# Patient Record
Sex: Male | Born: 1945 | Race: White | Hispanic: No | Marital: Married | Smoking: Never smoker
Health system: Southern US, Community
[De-identification: ages and names within clinical notes are randomized; demographics above are authoritative.]

## PROBLEM LIST (undated history)

## (undated) DIAGNOSIS — R011 Cardiac murmur, unspecified: Secondary | ICD-10-CM

## (undated) DIAGNOSIS — E559 Vitamin D deficiency, unspecified: Secondary | ICD-10-CM

## (undated) DIAGNOSIS — I420 Dilated cardiomyopathy: Secondary | ICD-10-CM

## (undated) DIAGNOSIS — G4730 Sleep apnea, unspecified: Secondary | ICD-10-CM

## (undated) DIAGNOSIS — M48061 Spinal stenosis, lumbar region without neurogenic claudication: Secondary | ICD-10-CM

## (undated) DIAGNOSIS — G5600 Carpal tunnel syndrome, unspecified upper limb: Secondary | ICD-10-CM

## (undated) DIAGNOSIS — I10 Essential (primary) hypertension: Secondary | ICD-10-CM

## (undated) DIAGNOSIS — G4733 Obstructive sleep apnea (adult) (pediatric): Secondary | ICD-10-CM

## (undated) DIAGNOSIS — R0609 Other forms of dyspnea: Secondary | ICD-10-CM

## (undated) DIAGNOSIS — I5189 Other ill-defined heart diseases: Secondary | ICD-10-CM

## (undated) DIAGNOSIS — T753XXA Motion sickness, initial encounter: Secondary | ICD-10-CM

## (undated) DIAGNOSIS — M4802 Spinal stenosis, cervical region: Secondary | ICD-10-CM

## (undated) DIAGNOSIS — G4700 Insomnia, unspecified: Secondary | ICD-10-CM

## (undated) DIAGNOSIS — Z7982 Long term (current) use of aspirin: Secondary | ICD-10-CM

## (undated) DIAGNOSIS — R7303 Prediabetes: Secondary | ICD-10-CM

## (undated) DIAGNOSIS — N400 Enlarged prostate without lower urinary tract symptoms: Secondary | ICD-10-CM

## (undated) DIAGNOSIS — I493 Ventricular premature depolarization: Secondary | ICD-10-CM

## (undated) DIAGNOSIS — I4729 Other ventricular tachycardia: Secondary | ICD-10-CM

## (undated) DIAGNOSIS — Z9989 Dependence on other enabling machines and devices: Secondary | ICD-10-CM

## (undated) HISTORY — DX: Essential (primary) hypertension: I10

## (undated) HISTORY — DX: Dependence on other enabling machines and devices: Z99.89

## (undated) HISTORY — PX: OTHER SURGICAL HISTORY: SHX169

## (undated) HISTORY — DX: Spinal stenosis, cervical region: M48.02

## (undated) HISTORY — DX: Vitamin D deficiency, unspecified: E55.9

## (undated) HISTORY — PX: CARDIAC ELECTROPHYSIOLOGY STUDY AND ABLATION: SHX1294

## (undated) HISTORY — DX: Ventricular premature depolarization: I49.3

## (undated) HISTORY — DX: Spinal stenosis, lumbar region without neurogenic claudication: M48.061

## (undated) HISTORY — PX: TOTAL HIP ARTHROPLASTY: SHX124

---

## 2012-06-07 DIAGNOSIS — D039 Melanoma in situ, unspecified: Secondary | ICD-10-CM | POA: Insufficient documentation

## 2012-06-07 DIAGNOSIS — Z860100 Personal history of colon polyps, unspecified: Secondary | ICD-10-CM | POA: Insufficient documentation

## 2012-06-07 DIAGNOSIS — N529 Male erectile dysfunction, unspecified: Secondary | ICD-10-CM | POA: Insufficient documentation

## 2012-06-07 DIAGNOSIS — J302 Other seasonal allergic rhinitis: Secondary | ICD-10-CM | POA: Insufficient documentation

## 2012-06-07 DIAGNOSIS — G4733 Obstructive sleep apnea (adult) (pediatric): Secondary | ICD-10-CM | POA: Insufficient documentation

## 2012-06-07 DIAGNOSIS — I10 Essential (primary) hypertension: Secondary | ICD-10-CM | POA: Insufficient documentation

## 2012-06-07 HISTORY — DX: Melanoma in situ, unspecified: D03.9

## 2012-06-07 HISTORY — DX: Other seasonal allergic rhinitis: J30.2

## 2012-06-07 HISTORY — DX: Male erectile dysfunction, unspecified: N52.9

## 2012-11-01 DIAGNOSIS — M1990 Unspecified osteoarthritis, unspecified site: Secondary | ICD-10-CM | POA: Insufficient documentation

## 2012-11-01 DIAGNOSIS — M170 Bilateral primary osteoarthritis of knee: Secondary | ICD-10-CM

## 2012-11-01 HISTORY — DX: Bilateral primary osteoarthritis of knee: M17.0

## 2012-11-01 HISTORY — DX: Unspecified osteoarthritis, unspecified site: M19.90

## 2012-12-30 ENCOUNTER — Ambulatory Visit: Payer: Self-pay | Admitting: General Practice

## 2012-12-30 LAB — BASIC METABOLIC PANEL
BUN: 13 mg/dL (ref 7–18)
Calcium, Total: 9 mg/dL (ref 8.5–10.1)
Chloride: 104 mmol/L (ref 98–107)
Co2: 26 mmol/L (ref 21–32)
Creatinine: 0.68 mg/dL (ref 0.60–1.30)
EGFR (African American): >60
EGFR (Non-African Amer.): >60
Glucose: 89 mg/dL (ref 65–99)
Potassium: 4 mmol/L (ref 3.5–5.1)
Sodium: 137 mmol/L (ref 136–145)

## 2012-12-30 LAB — URINALYSIS, COMPLETE
Bacteria: NONE SEEN
Glucose,UR: NEGATIVE mg/dL (ref 0–75)
Leukocyte Esterase: NEGATIVE
Nitrite: NEGATIVE
Ph: 5 (ref 4.5–8.0)
Squamous Epithelial: <1
WBC UR: 1 /HPF (ref 0–5)

## 2012-12-30 LAB — CBC
HCT: 39.1 % — ABNORMAL LOW (ref 40.0–52.0)
HGB: 13.7 g/dL (ref 13.0–18.0)
MCH: 31.8 pg (ref 26.0–34.0)
MCV: 91 fL (ref 80–100)
Platelet: 228 10*3/uL (ref 150–440)
RBC: 4.31 10*6/uL — ABNORMAL LOW (ref 4.40–5.90)
WBC: 9 10*3/uL (ref 3.8–10.6)

## 2012-12-30 LAB — PROTIME-INR: Prothrombin Time: 13.1 secs (ref 11.5–14.7)

## 2012-12-30 LAB — SEDIMENTATION RATE: Erythrocyte Sed Rate: 16 mm/hr (ref 0–20)

## 2012-12-31 LAB — URINE CULTURE

## 2013-01-15 ENCOUNTER — Inpatient Hospital Stay: Payer: Self-pay | Admitting: General Practice

## 2013-01-16 LAB — BASIC METABOLIC PANEL
Co2: 25 mmol/L (ref 21–32)
Creatinine: 0.68 mg/dL (ref 0.60–1.30)
EGFR (Non-African Amer.): >60
Osmolality: 276 (ref 275–301)
Potassium: 4 mmol/L (ref 3.5–5.1)
Sodium: 138 mmol/L (ref 136–145)

## 2013-01-17 LAB — BASIC METABOLIC PANEL
Anion Gap: 7 (ref 7–16)
BUN: 10 mg/dL (ref 7–18)
Calcium, Total: 8.3 mg/dL — ABNORMAL LOW (ref 8.5–10.1)
Chloride: 104 mmol/L (ref 98–107)
Co2: 26 mmol/L (ref 21–32)
Creatinine: 0.65 mg/dL (ref 0.60–1.30)
EGFR (African American): >60
EGFR (Non-African Amer.): >60
Glucose: 103 mg/dL — ABNORMAL HIGH (ref 65–99)
Osmolality: 273 (ref 275–301)
Potassium: 3.8 mmol/L (ref 3.5–5.1)
Sodium: 137 mmol/L (ref 136–145)

## 2013-01-17 LAB — HEMOGLOBIN: HGB: 11.7 g/dL — ABNORMAL LOW (ref 13.0–18.0)

## 2013-05-19 DIAGNOSIS — E538 Deficiency of other specified B group vitamins: Secondary | ICD-10-CM

## 2013-05-19 HISTORY — DX: Deficiency of other specified B group vitamins: E53.8

## 2014-09-25 NOTE — Discharge Summary (Signed)
PATIENT NAME:  Bryan RubensteinBRAY, Dyami W MR#:  [id number] DATE OF BIRTH:  Sep 25, 1945  DATE OF ADMISSION:  01/15/2013 DATE OF DISCHARGE:  01/18/2013  ADMITTING DIAGNOSIS: Degenerative arthrosis of the left hip.   DISCHARGE DIAGNOSIS: Degenerative arthrosis of the left hip.  HISTORY: The patient is a pleasant 69 year old gentleman who has been followed at [facility] for progression of left hip pain. The patient had previously underwent a total hip arthroplasty on the right approximately 15 years ago and had done well. Recently he has been having increased significant left hip and groin pain with weight-bearing activities. He had been having no relief with conservative care. The patient has tried losing weight, but has been having difficulty due to the severity of the left hip pain. The patient states that the pain had increased to the point that it was significantly interfering with his activities of daily living. X-rays taken in [facility] orthopedics showed significant narrowing of the cartilage space superiorly. There was subchondral sclerosis as well as osteophyte formation. After discussion of the risks and benefits of surgical intervention, the patient expressed his understanding of the risks and benefits and agreed with plans for surgical intervention.   PROCEDURE: Left total hip arthroplasty.   ANESTHESIA: Spinal.   IMPLANTS UTILIZED: DePuy 16.5 mm small stature AML femoral component stem, a 54 mm outer diameter Pinnacle 100 acetabular component, +4 mm neutral Pinnacle Marathon polyethylene liner, and a 36 mm M-Spec femoral head with a +1.5 mm neck length.   HOSPITAL COURSE: The patient tolerated the procedure very well. He had no complications. He was then taken to PAC-U where he was stabilized and then transferred to the orthopedic floor. The patient began receiving anticoagulation therapy of Lovenox 30 mg subcu q. 12 hours per anesthesia and pharmacy protocol. He was fitted with TED stockings  bilaterally. These were allowed to be removed 1 hour per 8 hour shift. The patient was also fitted with the AVI compression foot pumps bilaterally set at 80 mmHg. His calves have been nontender. There has been no evidence of any DVTs. Negative Homan signs. Heels were elevated off the bed using rolled towels.   The patient has denied any chest pain or any shortness of breath. Vital signs are stable. He has been afebrile. Hemodynamically he was stable and no transfusions were given postoperatively.   The patient's IV, Foley and Hemovac were DC'd on day 2 along with a dressing change. The wound was free of any drainage or signs of infection.   Physical therapy was initiated on day 1 for ADL and assistive devices. He has done very well. Upon being discharged was ambulating greater than 200 feet. He was independent with bed to chair transfers. He was able to go up and down 4 sets of steps. Occupational therapy was also initiated on day 1 for ADL and assistive devices.   DISPOSITION: The patient is being discharged to home in improved stable condition.   DISCHARGE INSTRUCTIONS: He is to continue weight bearing as tolerated. Continue using a walker until cleared by physical therapy to go to a quad cane. He will receive home health PT. Continue with TED stockings bilaterally. These are to be worn during the day but may be removed at night. Elevate the heels when sitting or in bed. Incentive spirometer q. 1 hours while awake. He is placed on a regular diet. The dressing is to stay clean and dry. It is not to be changed unless needed. He is not to take a shower.  Staples will be removed on 08/27 followed by the application of benzoin and Steri-Strips. He will continue receiving home health PT. He has a follow-up appointment in [facility] on 09/25 at 10:45. He is to call the clinic sooner if any temperatures of 101.5 or greater or excessive bleeding.   DRUG ALLERGIES: No known drug allergies.   DISCHARGE  MEDICATIONS: The patient is to resume his regular medication that he was on prior to admission. He was given a prescription for Roxicodone 5 to 10 mg q. 4 to 6 hours p.r.n. for pain, Tramadol 50 to 100 mg q. 4 to 6 hours p.r.n. for pain, Lovenox 40 mg sub-Q daily for 14 days and then discontinue and begin taking one 81 mg enteric-coated aspirin.   PAST MEDICAL HISTORY: 1.  Heart murmur.  2.  Hypertension.  3.  Melanoma. 4.  Obesity. 5.  Diverticulosis bleeding in 2008. 6.  Cancer. 7.  Cataracts. 8.  Psoriasis. 9.  Hypercholesterolemia.   ____________________________ Van Clines, PA jrw:sb D: 02/04/2013 07:59:18 ET T: 02/04/2013 08:18:38 ET JOB#: [id number]  cc: Van Clines, PA, <Dictator> Keonna Raether PA ELECTRONICALLY SIGNED 02/05/2013 7:26

## 2014-09-25 NOTE — Op Note (Signed)
PATIENT NAME:  Bryan Boyle, Bryan Boyle MR#:  [id number] DATE OF BIRTH:  11-Aug-1945  DATE OF PROCEDURE:  01/15/2013  PREOPERATIVE DIAGNOSIS:  Degenerative arthrosis of the left hip.   POSTOPERATIVE DIAGNOSIS:  Degenerative arthrosis of the left hip.   PROCEDURE PERFORMED:  Left total hip arthroplasty.   SURGEON:  Illene LabradorJames P. Hooten, M.D.   ASSISTANT:  Van ClinesJon Wolfe, PA (required to maintain retraction throughout the procedure)   ANESTHESIA:  Spinal.   ESTIMATED BLOOD LOSS:  200 mL.   FLUIDS REPLACED:  4300 mL of crystalloid.   DRAINS:  Two medium drains to Hemovac reservoir.   IMPLANTS UTILIZED:  DePuy 16.5 mm small stature AML femoral stem, a 54 mm outer diameter Pinnacle 100 acetabular component, +4 mm neutral Pinnacle Marathon polyethylene liner, and a 36 mm M-Spec femoral head with a +1.5 mm neck length.   INDICATIONS FOR SURGERY:  The patient is a 69 year old male who has been seen for complaints of progressive left hip and groin pain.  X-rays demonstrate severe degenerative changes to the left hip.  After discussion of the risks and benefits of surgical intervention, the patient expressed understanding of the risks and benefits and agreed with plans for surgical intervention.   PROCEDURE IN DETAIL:  The patient was brought into the Operating Room and, after adequate spinal anesthesia was achieved, the patient was placed in right lateral decubitus position.  An axillary roll was placed and all bony prominences were well-padded.  The patient's left hip and leg were cleaned and prepped with alcohol and DuraPrep, draped in the usual sterile fashion.  A "timeout" was performed as per usual protocol.  A lateral curvilinear incision was made gently curving towards the posterior superior iliac spine.  IT band was incised in line with the skin incision and the fibers of the gluteus maximus were split in line.  Piriformis tendon was identified, skeletonized, and incised at its insertion at the proximal femur and  reflected posteriorly.  In a similar fashion, short external rotators were incised and reflected posteriorly.  A T-type posterior capsulotomy was performed.  Prior to dislocation of the femoral head, a threaded Steinmann pin was inserted through a separate stab incision into the pelvis superior to the acetabulum and stylus was formed so as to assess limb length and hip offset throughout the procedure.  The femoral head was then dislocated posteriorly.  Inspection of the head showed severe degenerative changes with full thickness loss of articular cartilage superiorly.  Prominent osteophytes were also noted.  The femoral neck cut was performed using an oscillating saw.  The anterior capsule was elevated off of the femoral neck.  Inspection of the acetabulum also demonstrated significant degenerative changes.  Remnant of the labrum was excised.  The acetabulum was reamed in a sequential fashion up to a 53 mm diameter.  This allowed for excellent punctate bleeding bone.  A 54 mm outer diameter Pinnacle 100 acetabular component was positioned and impacted into place.  Excellent scratch fit was appreciated.  A +4 mm neutral polyethylene trial was inserted and attention was directed to the proximal femur.  Pilot hole for reaming of the proximal femoral canal was created using a high-speed bur.  Proximal femoral canal was reamed in a sequential fashion up to a 16 mm diameter.  This allowed for approximately 6.5 mm of scratch fit.  The proximal femur was then prepared using a 16.5 mm aggressive side-biting reamer.  Serial broaches were inserted up to a 16.5 mm small stature broach.  The calcar region was planed and trial reduction was performed with a 36 mm hip ball with a +1.5 mm neck length.  Good equalization of limb lengths was appreciated and a good hip offset was noted.  Excellent stability was noted both anteriorly and posteriorly.  The hip was dislocated and the trial was removed.  The acetabular shell was irrigated  with copious amounts of normal saline with antibiotic solution and then suctioned dry.  A +4 mm neutral Pinnacle Marathon polyethylene liner was positioned and impacted into place.  Next, a 16.5 mm small stature AML femoral component was positioned and impacted into place.  Excellent scratch fit was appreciated.  Trial reduction was again performed with a 36 mm hip ball with a +1.5 mm neck length.  Again, excellent stability was noted both anteriorly and posteriorly and good equalization of limb lengths was appreciated.  Trial hip ball was removed.  The Morse taper was cleaned and dried and a 36 mm M-Spec femoral head with a +1.5 mm neck length was placed on the trunnion and impacted into place.  The hip was reduced and placed through a range of motion.  Again, excellent stability was noted anteriorly and posteriorly and appropriate limb lengths were appreciated.  The wound was irrigated with copious amounts of normal saline with antibiotic solution using pulsatile lavage and then suctioned dry.  Good hemostasis was appreciated.  The posterior capsulotomy was repaired using #5 Ethibond.  The piriformis tendon was reapproximated on the undersurface of the gluteus medius tendon.  The gluteal sling that had been previously released for mobilization of the femur was also repaired using #5 Ethibond.  Two medium drains were placed through the wound bed and brought out through a separate stab incision to be attached to a Hemovac reservoir.  IT band was repaired using interrupted sutures of #1 Vicryl.  The subcutaneous tissue was approximated in layers using first #0 Vicryl followed by #2-0 Vicryl.  Skin was closed with skin staples.  Sterile dressing was applied.   The patient tolerated the procedure well.  He was transported to the recovery room in stable condition.    ____________________________ Illene Labrador. Angie Fava., MD jph:ea D: 01/16/2013 06:33:28 ET T: 01/16/2013 06:53:32 ET JOB#: [id number]  cc: Illene Labrador.  Angie Fava., MD, <Dictator> JAMES P Angie Fava MD ELECTRONICALLY SIGNED 02/12/2013 7:07

## 2014-12-27 DIAGNOSIS — Z96643 Presence of artificial hip joint, bilateral: Secondary | ICD-10-CM | POA: Insufficient documentation

## 2016-02-28 ENCOUNTER — Other Ambulatory Visit: Payer: Self-pay | Admitting: Orthopedic Surgery

## 2016-02-28 DIAGNOSIS — M545 Low back pain, unspecified: Secondary | ICD-10-CM

## 2016-02-28 DIAGNOSIS — G8929 Other chronic pain: Secondary | ICD-10-CM

## 2016-04-14 ENCOUNTER — Ambulatory Visit
Admission: RE | Admit: 2016-04-14 | Discharge: 2016-04-14 | Disposition: A | Discharge status: Home / Self Care | Payer: Medicare Other | Source: Ambulatory Visit | Attending: Orthopedic Surgery | Admitting: Orthopedic Surgery

## 2016-04-14 DIAGNOSIS — G8929 Other chronic pain: Secondary | ICD-10-CM | POA: Diagnosis not present

## 2016-04-14 DIAGNOSIS — M5126 Other intervertebral disc displacement, lumbar region: Secondary | ICD-10-CM | POA: Insufficient documentation

## 2016-04-14 DIAGNOSIS — M545 Low back pain, unspecified: Secondary | ICD-10-CM

## 2016-04-14 DIAGNOSIS — M48061 Spinal stenosis, lumbar region without neurogenic claudication: Secondary | ICD-10-CM | POA: Insufficient documentation

## 2016-04-14 DIAGNOSIS — M4316 Spondylolisthesis, lumbar region: Secondary | ICD-10-CM | POA: Diagnosis not present

## 2017-04-24 ENCOUNTER — Other Ambulatory Visit: Payer: Self-pay | Admitting: Neurosurgery

## 2017-04-24 DIAGNOSIS — M4312 Spondylolisthesis, cervical region: Secondary | ICD-10-CM

## 2017-05-09 ENCOUNTER — Other Ambulatory Visit: Payer: Medicare Other

## 2017-05-14 ENCOUNTER — Other Ambulatory Visit: Payer: Medicare Other

## 2017-05-20 ENCOUNTER — Ambulatory Visit
Admission: RE | Admit: 2017-05-20 | Discharge: 2017-05-20 | Disposition: A | Discharge status: Home / Self Care | Payer: Medicare Other | Source: Ambulatory Visit | Attending: Neurosurgery | Admitting: Neurosurgery

## 2017-05-20 DIAGNOSIS — M4312 Spondylolisthesis, cervical region: Secondary | ICD-10-CM

## 2018-07-19 DIAGNOSIS — M1712 Unilateral primary osteoarthritis, left knee: Secondary | ICD-10-CM | POA: Insufficient documentation

## 2018-07-19 DIAGNOSIS — M1711 Unilateral primary osteoarthritis, right knee: Secondary | ICD-10-CM | POA: Insufficient documentation

## 2019-04-02 IMAGING — MR MR CERVICAL SPINE W/O CM
4 of 5 series · 29 of 48 positions shown · non-contrast
Comparison: Cervical spine radiographs 04/16/2017

CLINICAL DATA: Left-sided neck pain for 1 year. Right-sided neck
pain for 6 months. Weakness and numbness greater on the right.

EXAM:
MRI CERVICAL SPINE WITHOUT CONTRAST
TECHNIQUE: Multiplanar, multisequence MR imaging of the cervical spine was
performed. No intravenous contrast was administered.

[Series 6: T1 · sagittal · 3.0mm · 0.66mm/px · 8 of 17 slices shown]
[im 1/17]
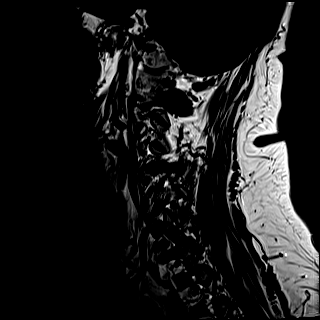
[im 3/17]
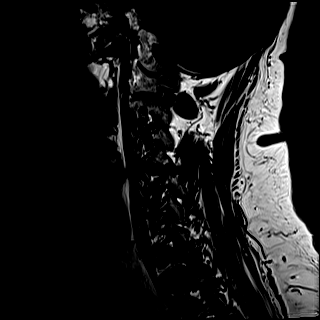
[im 5/17]
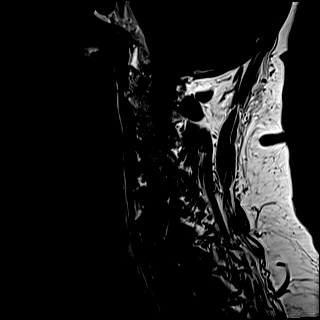
[im 7/17]
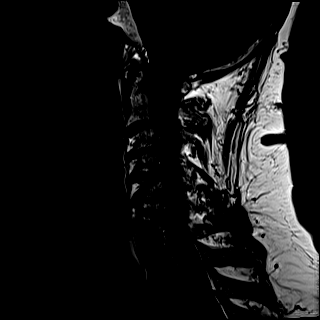
[im 10/17]
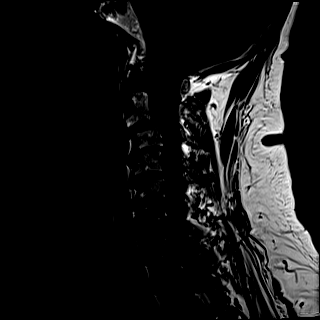
[im 12/17]
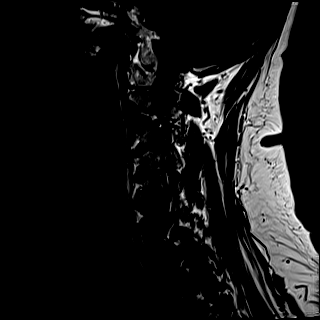
[im 14/17]
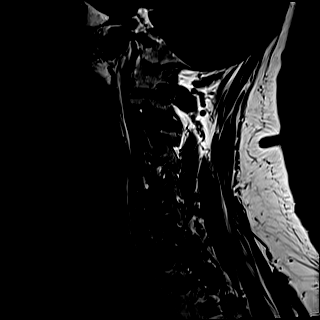
[im 17/17]
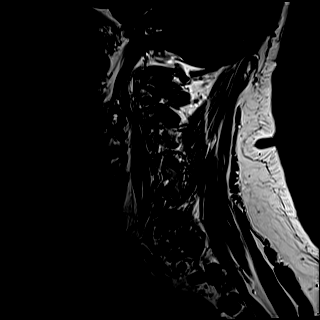

[Series 7: T2 · sagittal · 3.0mm · 0.55mm/px · 7 of 17 slices shown (1 of 2)]
[im 1/17]
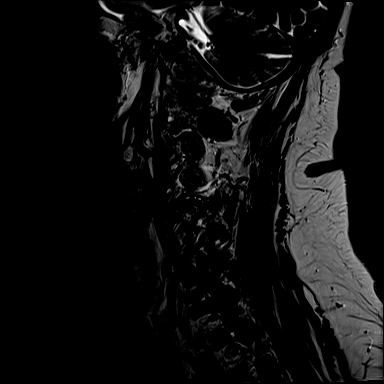
[im 3/17]
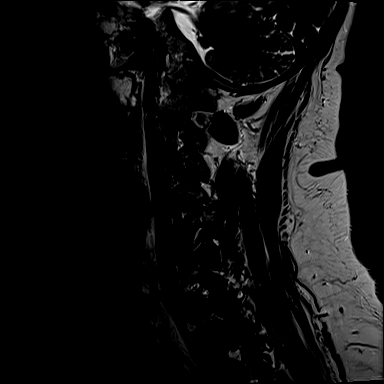
[im 6/17]
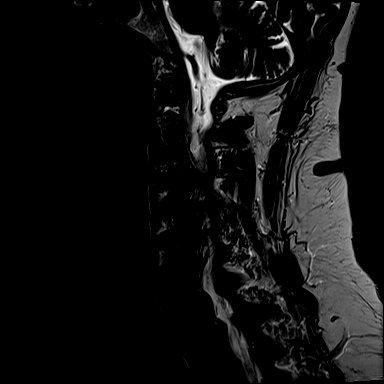
[im 9/17]
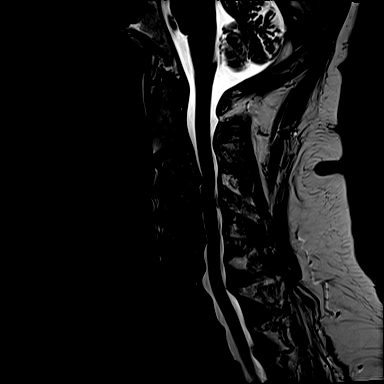
[im 11/17]
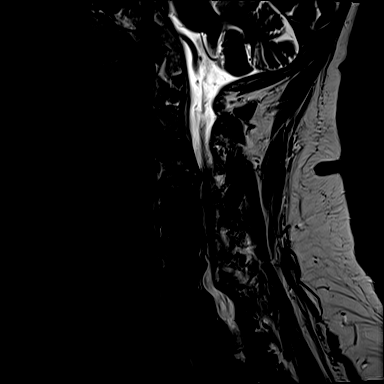
[im 14/17]
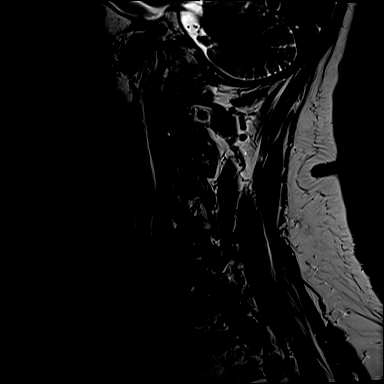
[im 17/17]
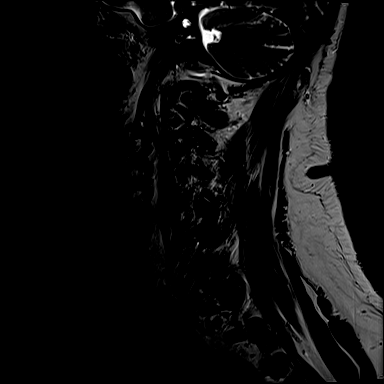

[Series 8: STIR · sagittal · 3.0mm · 0.33mm/px · 5 of 17 slices shown]
[im 1/17]
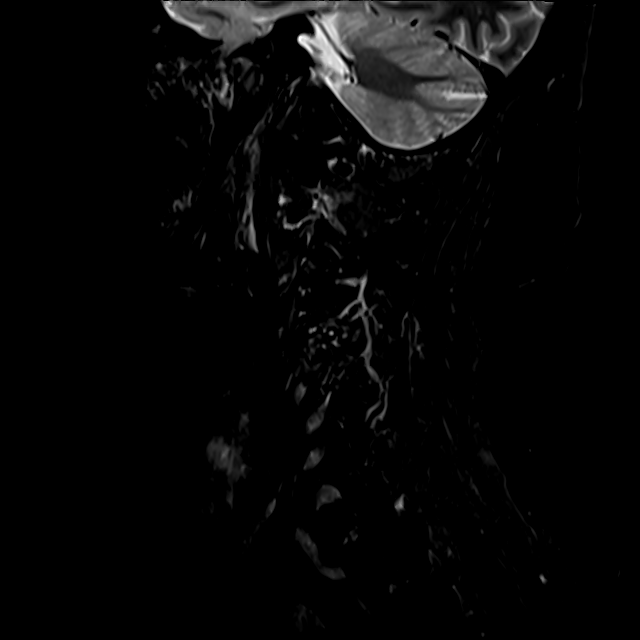
[im 3/17]
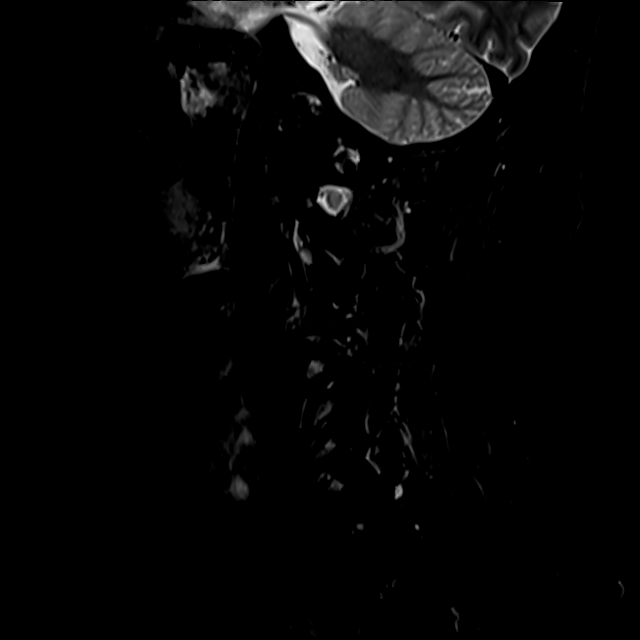
[im 6/17]
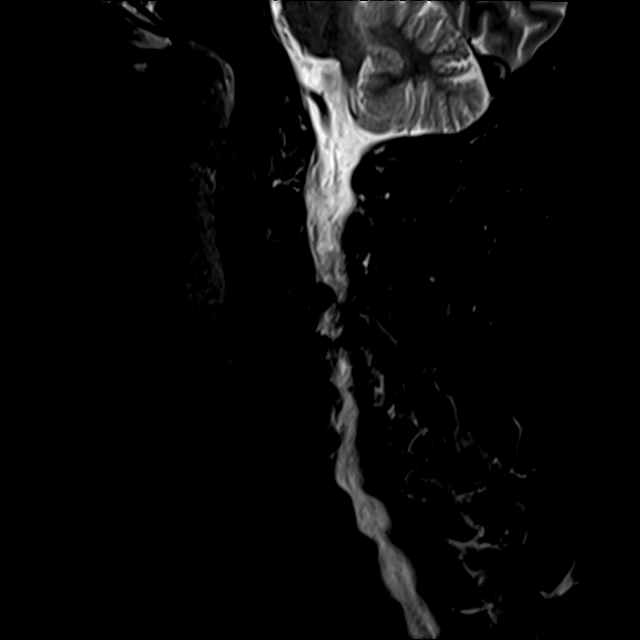
[im 9/17]
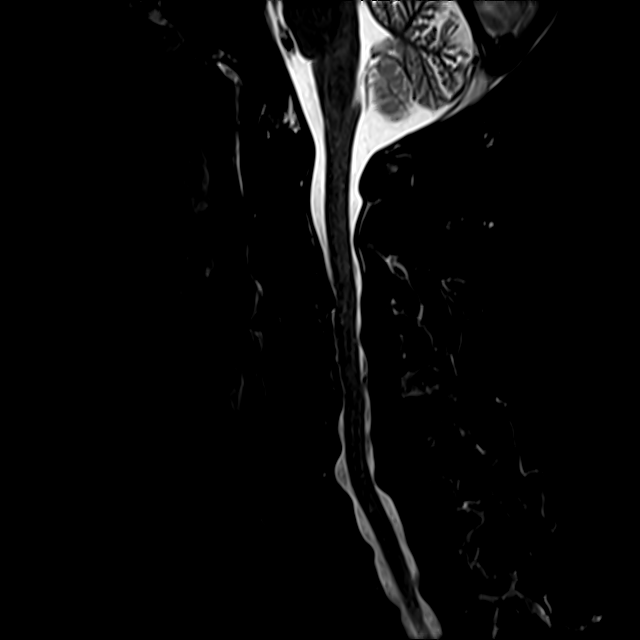
[im 14/17]
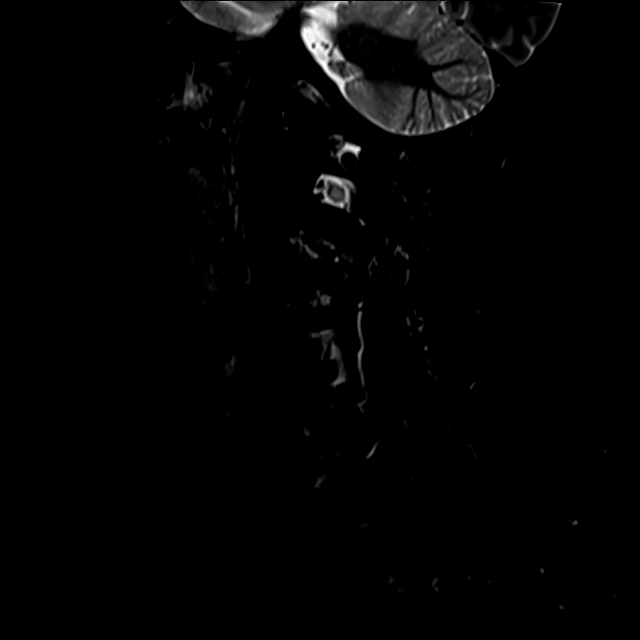

[Series 9: T2 · axial · 3.0mm · 0.50mm/px · z∈[-100,-1]mm · 9 of 32 slices shown (2 of 2)]
[im 1/32]
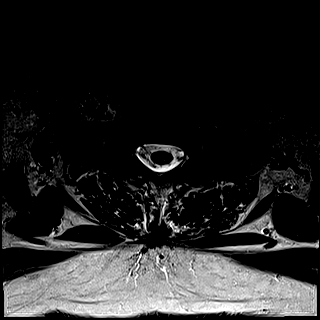
[im 6/32]
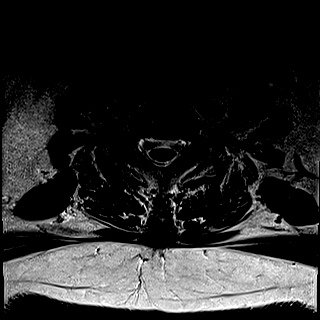
[im 11/32]
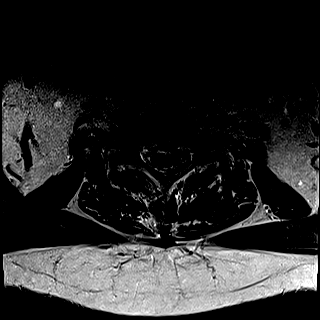
[im 13/32]
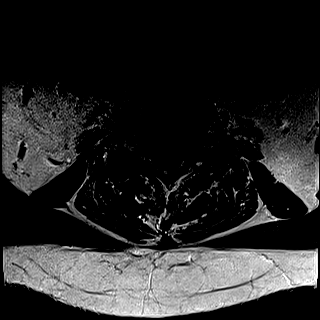
[im 16/32]
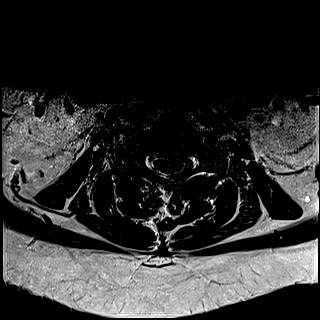
[im 19/32]
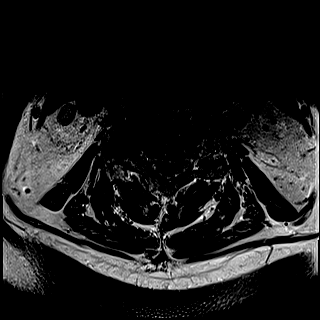
[im 21/32]
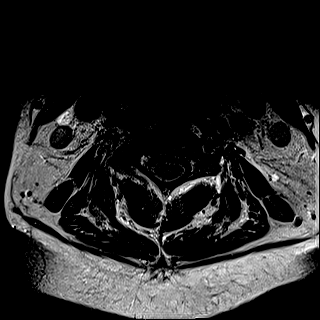
[im 26/32]
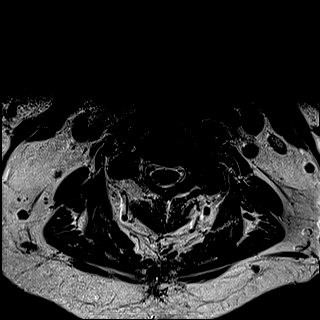
[im 32/32]
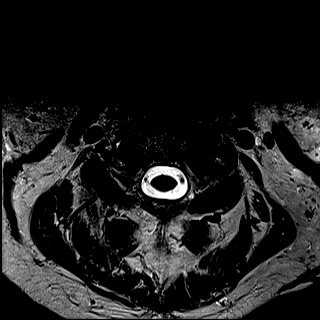

[29 of 48 positions shown; findings below may reference images not displayed]

FINDINGS: Alignment: Mild reversal of the normal cervical lordosis. Minimal
anterolisthesis of C2 on C3 and C4 on C5. 4 mm anterolisthesis of C7
on T1. Minimal retrolisthesis of C3 on C4 and C5 on C6.

Vertebrae: No fracture or suspicious osseous lesion. Facet edema on
the left at C2-3 and C7-T1 and on the right at C4-5 and C7-T1. Mild
degenerative endplate edema at C3-4.

Cord: Normal signal.

Posterior Fossa, vertebral arteries, paraspinal tissues:
Unremarkable.

Disc levels:

Moderate to severe disc space narrowing throughout the cervical
spine with exception of C2-3. Diffuse disc desiccation.

C2-3: Anterolisthesis with mild disc uncovering and mild right and
severe left facet arthrosis without stenosis.

C3-4: Broad-based posterior disc osteophyte complex greater to the
right results in mild spinal stenosis with a slight impression on
the ventral spinal cord and severe right and moderate left neural
foraminal stenosis.

C4-5: Disc bulging asymmetric to the right, right uncovertebral
spurring, and severe right facet arthrosis result in moderate right
neural foraminal stenosis and mild spinal stenosis.

C5-6: Broad-based posterior disc osteophyte complex results in mild
spinal stenosis and mild right and severe left neural foraminal
stenosis.

C6-7: Broad-based posterior disc osteophyte complex results in mild
spinal stenosis and moderate to severe right and severe left neural
foraminal stenosis.

C7-T1: Anterolisthesis with disc uncovering, uncovertebral spurring,
and moderate facet arthrosis result in mild left neural foraminal
stenosis without spinal stenosis.

T1-2: Only imaged sagittally. Disc bulging asymmetric to the right,
endplate spurring, and mild right facet arthrosis result in moderate
right neural foraminal stenosis without spinal stenosis.

T2-3: Only imaged sagittally. Mild disc bulging and mild facet
arthrosis without stenosis.
IMPRESSION: 1. Advanced diffuse cervical disc and facet degeneration. Mild
spinal stenosis from C3-4 to C6-7.
2. Moderate to severe multilevel neural foraminal stenosis as above.

## 2019-09-23 DIAGNOSIS — I493 Ventricular premature depolarization: Secondary | ICD-10-CM | POA: Insufficient documentation

## 2019-10-09 DIAGNOSIS — I429 Cardiomyopathy, unspecified: Secondary | ICD-10-CM | POA: Insufficient documentation

## 2019-10-09 HISTORY — DX: Cardiomyopathy, unspecified: I42.9

## 2020-01-26 HISTORY — PX: CARDIAC ELECTROPHYSIOLOGY STUDY AND ABLATION: SHX1294

## 2020-01-26 HISTORY — PX: LEFT HEART CATH: SHX5946

## 2020-11-15 DIAGNOSIS — H353133 Nonexudative age-related macular degeneration, bilateral, advanced atrophic without subfoveal involvement: Secondary | ICD-10-CM

## 2020-11-15 DIAGNOSIS — D3132 Benign neoplasm of left choroid: Secondary | ICD-10-CM

## 2020-11-15 HISTORY — DX: Benign neoplasm of left choroid: D31.32

## 2020-11-15 HISTORY — DX: Nonexudative age-related macular degeneration, bilateral, advanced atrophic without subfoveal involvement: H35.3133

## 2021-05-23 DIAGNOSIS — Z8679 Personal history of other diseases of the circulatory system: Secondary | ICD-10-CM | POA: Insufficient documentation

## 2021-07-14 DIAGNOSIS — R7303 Prediabetes: Secondary | ICD-10-CM | POA: Insufficient documentation

## 2021-07-14 DIAGNOSIS — E559 Vitamin D deficiency, unspecified: Secondary | ICD-10-CM | POA: Insufficient documentation

## 2022-07-27 DIAGNOSIS — H3530 Unspecified macular degeneration: Secondary | ICD-10-CM

## 2022-07-27 HISTORY — DX: Unspecified macular degeneration: H35.30

## 2023-01-19 DIAGNOSIS — G894 Chronic pain syndrome: Secondary | ICD-10-CM | POA: Insufficient documentation

## 2023-01-19 HISTORY — DX: Chronic pain syndrome: G89.4

## 2023-05-25 DIAGNOSIS — H53413 Scotoma involving central area, bilateral: Secondary | ICD-10-CM

## 2023-05-25 HISTORY — DX: Scotoma involving central area, bilateral: H53.413

## 2023-09-27 NOTE — Progress Notes (Unsigned)
 Referring Physician:  No referring provider defined for this encounter.  Primary Physician:  System, Provider Not In  History of Present Illness: 10/02/2023 Bryan Boyle has a history of CAD, RFA for cardiac arrhythmia, HTN, high cholesterol, OSA, macular degeneration.   Primary complaint is numbness/tingling in bilateral hands, worse in index and middle finger x 10+ years. Symptoms worse at rest. He has intermittent neck pain that tends to be worse at night- it is tolerable. No radicular arm pain. Wearing rubber bands on his fingers helps with his numbness/tingling. He has weakness in both hands. No numbness/tingling in his arms. He notes some dexterity issues. He has some chronic balance issues.   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   He has intermittent LBP with occasional left posterior leg pain to his knee. He has occasional weakness in left leg. He has numbness and tingling in his feet- neurontin helps this.   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   He does not smoke.   Bowel/Bladder Dysfunction: none  Conservative measures:  Physical therapy:  has done OT for hands and PT for his neck about 2 years ago with improvement.   Multimodal medical therapy including regular antiinflammatories: Gabapentin, Meloxicam  Injections: no epidural steroid injections  Past Surgery: no spinal surgeries  Bryan Boyle has some dexterity issues. He has chronic balance issues.   The symptoms are causing a significant impact on the patient's life.   Review of Systems:  A 10 point review of systems is negative, except for the pertinent positives and negatives detailed in the HPI.  Past Medical History: History reviewed. No pertinent past medical history.  Past Surgical History: History reviewed. No pertinent surgical  history.  Allergies: Allergies as of 10/02/2023 - Review Complete 10/02/2023  Allergen Reaction Noted   Rofecoxib Other (See Comments) 04/11/2012    Medications: Outpatient Encounter Medications as of 10/02/2023  Medication Sig   cetirizine (ZYRTEC) 10 MG tablet Take 1 tablet by mouth daily.   lovastatin (MEVACOR) 40 MG tablet Take 40 mg by mouth.   nitroGLYCERIN (NITROSTAT) 0.4 MG SL tablet 0 Refill(s)   spironolactone (ALDACTONE) 25 MG tablet Take 1 tablet by mouth daily.   ascorbic acid (VITAMIN C) 500 MG tablet Take 500 mg by mouth.   aspirin EC 81 MG tablet Take 81 mg by mouth.   Cholecalciferol 125 MCG (5000 UT) TABS Take 5,000 Units by mouth.   Coenzyme Q10 10 MG capsule Take 10 mg by mouth.   CRANBERRY PO Take 25,000 mg by mouth.   cyanocobalamin (VITAMIN B12) 1000 MCG tablet Take 1,000 mcg by mouth.   gabapentin (NEURONTIN) 400 MG capsule Take 400 mg by mouth.   magnesium oxide (MAG-OX) 400 MG tablet Take 250 mg by mouth.   Melatonin 12 MG TABS Take 1 tablet by mouth daily.   potassium chloride (KLOR-CON M) 10 MEQ tablet Take 10 mEq by mouth.   No facility-administered encounter medications on file as of 10/02/2023.    Social History: Social History   Tobacco Use   Smoking status: Never   Smokeless tobacco: Never    Family Medical History: History reviewed. No pertinent family history.  Physical Examination: Vitals:   10/02/23 1330  BP: 122/78    General: Patient is well developed, well nourished, calm, collected, and in no apparent distress. Attention to examination is appropriate.  Respiratory: Patient is  breathing without any difficulty.   NEUROLOGICAL:     Awake, alert, oriented to person, place, and time.  Speech is clear and fluent. Fund of knowledge is appropriate.   Cranial Nerves: Pupils equal round and reactive to light.  Facial tone is symmetric.    No posterior cervical tenderness. No tenderness in bilateral trapezial region.   No abnormal  lesions on exposed skin.   Strength: Side Biceps Triceps Deltoid Interossei Grip Wrist Ext. Wrist Flex.  R 5 5 5 5 5 5 5   L 5 5 5 5 5 5 5    Side Iliopsoas Quads Hamstring PF DF EHL  R 5 5 5 5 5 5   L 5 5 5 5 5 5    Reflexes are 2+ and symmetric at the biceps, brachioradialis, patella and achilles.   Hoffman's is absent.  Clonus is not present.   Bilateral upper and lower extremity sensation is intact to light touch.     Reasonable ROM of both shoulders with no pain.   No pain with IR/ER of both hips.   Negative tinels at wrist and elbow bilaterally. Negative phalens at wrist bilaterally.   Gait is slow.     Medical Decision Making  Imaging: No recent cervical or lumbar imaging.   Assessment and Plan: Bryan Boyle primary complaint is numbness/tingling in bilateral hands, worse in index and middle finger x 10+ years. He has intermittent neck pain that tends to be worse at night- it is tolerable. No radicular arm pain.   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   He also has intermittent LBP with occasional left posterior leg pain to his knee. He has occasional weakness in left leg. He has numbness and tingling in his feet- neurontin helps this.   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   Treatment options discussed with patient and following plan made:   - MRI of cervical spine to further evaluate neck pain along with previous seen C8 radiculopathy on EMG.  - Bilateral upper extremity EMG/NCS to evaluate numbness and tingling in hands. Dr. Mason Sole did his last EMG. Referral sent to him.  - Lumbar xrays with flex/ext to be done when he has cervical MRI.  - May revisit PT/OT depending on above results, this has helped him in  the past.  - He would like to follow up when MRI, xrays, and EMG are all complete. Will schedule visit with me once I have all of the results  I spent a total of 45 minutes in face-to-face and non-face-to-face activities related to this patient's care today including review of outside records, review of imaging, review of symptoms, physical exam, discussion of differential diagnosis, discussion of treatment options, and documentation.   Thank you for involving me in the care of this patient.   Lucetta Russel PA-C Dept. of Neurosurgery

## 2023-10-02 ENCOUNTER — Encounter: Payer: Self-pay | Admitting: Orthopedic Surgery

## 2023-10-02 ENCOUNTER — Telehealth: Payer: Self-pay | Admitting: Orthopedic Surgery

## 2023-10-02 ENCOUNTER — Ambulatory Visit (INDEPENDENT_AMBULATORY_CARE_PROVIDER_SITE_OTHER): Admitting: Orthopedic Surgery

## 2023-10-02 VITALS — BP 122/78 | Ht 71.0 in | Wt 316.0 lb

## 2023-10-02 DIAGNOSIS — M4802 Spinal stenosis, cervical region: Secondary | ICD-10-CM

## 2023-10-02 DIAGNOSIS — M5412 Radiculopathy, cervical region: Secondary | ICD-10-CM

## 2023-10-02 DIAGNOSIS — M47816 Spondylosis without myelopathy or radiculopathy, lumbar region: Secondary | ICD-10-CM

## 2023-10-02 DIAGNOSIS — R200 Anesthesia of skin: Secondary | ICD-10-CM

## 2023-10-02 DIAGNOSIS — M5030 Other cervical disc degeneration, unspecified cervical region: Secondary | ICD-10-CM

## 2023-10-02 DIAGNOSIS — M4316 Spondylolisthesis, lumbar region: Secondary | ICD-10-CM | POA: Diagnosis not present

## 2023-10-02 DIAGNOSIS — M542 Cervicalgia: Secondary | ICD-10-CM

## 2023-10-02 NOTE — Telephone Encounter (Signed)
 I ordered EMG for bilateral upper extremities with Dr. Mason Sole. He did previous EMG in 2018.   Thanks.

## 2023-10-02 NOTE — Patient Instructions (Signed)
 It was so nice to see you today. Thank you so much for coming in.    Previous imaging showed wear and tear in your neck and lower back.   Nerve test from 2018 showed neuropathy (nerves being turned on) and C8 radiculopathy (likely from your neck).   I want to get an MRI of your neck to look into things further. We will get this approved through your insurance and [facility] will call you to schedule the appointment. Ask about your patient responsibility. You do not need to pay this prior to getting MRI, they can bill you. Make sure you are scheduled for WIDE BORE MRI.   When you get the MRI, I also want them to do xrays of your lower back.   After you have the MRI and xrays, it takes 14-21 days for me to get the results back. Once I have them and the nerve test results, we will call you to schedule a follow up visit with me to review them.    I want you to see neurology at the [facility] clinic (Dr. Mason Sole) for a nerve test (EMG) in your arms. They should call you to schedule an appointment or you can call them at [phone].   Please do not hesitate to call if you have any questions or concerns. You can also message me in MyChart.   Lucetta Russel PA-C [phone]     The physicians and staff at [facility] at [facility] are committed to providing excellent care. You may receive a survey asking for feedback about your experience at our office. We value you your feedback and appreciate you taking the time to to fill it out. [facility] leadership team is also available to discuss your experience in person, feel free to contact us  [phone].

## 2023-10-02 NOTE — Telephone Encounter (Signed)
 Faxed to [facility]

## 2023-10-03 ENCOUNTER — Encounter: Payer: Self-pay | Admitting: Orthopedic Surgery

## 2023-10-03 NOTE — Addendum Note (Signed)
 Addended by: Babara Lerner on: 10/03/2023 12:05 PM   Modules accepted: Orders

## 2023-11-12 ENCOUNTER — Ambulatory Visit
Admission: RE | Admit: 2023-11-12 | Discharge: 2023-11-12 | Disposition: A | Discharge status: Home / Self Care | Source: Ambulatory Visit | Attending: Orthopedic Surgery | Admitting: Orthopedic Surgery

## 2023-11-12 DIAGNOSIS — M47816 Spondylosis without myelopathy or radiculopathy, lumbar region: Secondary | ICD-10-CM | POA: Diagnosis present

## 2023-11-12 DIAGNOSIS — M4316 Spondylolisthesis, lumbar region: Secondary | ICD-10-CM | POA: Insufficient documentation

## 2023-11-12 DIAGNOSIS — M4802 Spinal stenosis, cervical region: Secondary | ICD-10-CM | POA: Insufficient documentation

## 2023-11-12 DIAGNOSIS — M542 Cervicalgia: Secondary | ICD-10-CM | POA: Insufficient documentation

## 2023-11-12 DIAGNOSIS — M5412 Radiculopathy, cervical region: Secondary | ICD-10-CM | POA: Diagnosis not present

## 2023-11-13 NOTE — Procedures (Signed)
 Test Date:  11/12/2023  Patient: Bryan Boyle DOB: February 17, 1946 Physician: Dr. Jannett Fairly  Chart#: [id number] Sex: Male Ref Phys: Glade Boys   Patient History: Patient is a 78 year-old male who presents with bilateral hand numbness and tingling.  Denies neck pain but does have stiffness.  He is an Advertising account planner on the computer.  Past medical history is significant for cancer.   Exam:  Manual muscle testing revealed bilateral thenar and intrinsic hand muscle atrophy.  Sensation testing revealed decreased sensation to left hand in both median distributions.  EMG & NCV Findings: Evaluation of the Left median motor, the Right median motor, the Left median sensory, the Right median sensory, and the Left ulnar sensory nerves showed no response (Wrist).  The Left ulnar motor nerve showed prolonged distal onset latency (4.5 ms), reduced amplitude (1.6 mV), and decreased conduction velocity (B Elbow-Wrist, 25 m/s).  The Right ulnar motor nerve showed prolonged distal onset latency (4.1 ms), reduced amplitude (2.1 mV), and decreased conduction velocity (A Elbow-B Elbow, 19 m/s).  The Right radial sensory nerve showed prolonged distal peak latency (2.6 ms).  The Right ulnar sensory nerve showed prolonged distal peak latency (4.4 ms), reduced amplitude (1.4 V), and decreased conduction velocity (Wrist-5th Digit, 27 m/s).  The Left median/ulnar (palm) comparison nerve showed no response (Median Palm) and no response (Ulnar Palm).  The Right median/ulnar (palm) comparison nerve showed no response (Median Palm), no response (Ulnar Palm), and no response (Site 3).  All remaining nerves (as indicated in the following tables) were within normal limits.    EMG   Side Muscle Nerve Root Ins Act Fibs Psw Amp Dur Poly Recrt Int Bruna Comment  Left Abd Poll Brev Median C8-T1 Nml Nml Nml Incr >61ms 2+ moderate Reduced Nml   Left 1stDorInt Ulnar C8-T1 Nml Nml Nml Incr >16ms 2+ moderate Reduced Nml   Left ExtDigCom Radial (Post  Int) C7-8 Nml Nml Nml Incr >75ms 1+ mild Reduced Nml   Left ABD Dig Min Ulnar C8-T1 Nml Nml Nml Incr >59ms 1+ mild Reduced Nml   Left PronatorTeres Median C6-7 Nml Nml Nml Nml Nml 0 Nml Nml   Left Biceps Musculocut C5-6 Nml Nml Nml Nml Nml 0 Nml Nml   Left Triceps Radial C6-7-8 Nml Nml Nml Nml Nml 0 Nml Nml   Left Deltoid Axillary C5-6 Nml Nml Nml Nml Nml 0 Nml Nml   Left Cervical Parasp Up Rami C1-3 Nml Nml Nml        Left Cervical Parasp Mid Rami C4-6 Nml Nml Nml        Left Cervical Parasp Low Rami C7-8 Nml Nml Nml        Right Abd Poll Brev Median C8-T1 Nml Nml Nml Incr >75ms 2+ moderate Reduced Nml   Right 1stDorInt Ulnar C8-T1 Nml Nml Nml Incr >47ms 2+ moderate Reduced Nml   Right ExtDigCom Radial (Post Int) C7-8 Nml Nml Nml Incr >34ms 1+ mild Reduced Nml   Right ABD Dig Min Ulnar C8-T1 Nml Nml Nml Incr >52ms 1+ mild Reduced Nml   Right PronatorTeres Median C6-7 Nml Nml Nml Nml Nml 0 Nml Nml   Right Biceps Musculocut C5-6 Nml Nml Nml Nml Nml 0 Nml Nml   Right Triceps Radial C6-7-8 Nml Nml Nml Nml Nml 0 Nml Nml   Right Deltoid Axillary C5-6 Nml Nml Nml Nml Nml 0 Nml Nml   Right Cervical Parasp Up Rami C1-3 Nml Nml Nml        Right  Cervical Parasp Mid Rami C4-6 Nml Nml Nml        Right Cervical Parasp Low Rami C7-8 Nml Nml Nml          Impression: This is an abnormal electrodiagnostic study consistent with a generalized severe sensorimotor polyneuropathy, superimposed bilateral carpal tunnel syndrome, can't rule out superimposed bilateral ulnar neuropathies.  Clinical Correlation: Compared to last study, there is less suspicion of bilateral C8 active denervation.   Thank you for the referral of this patient. It was our privilege to participate in care of your patient.  Feel free to contact us  with any further questions.   _____________________________ Jannett Fairly, M.D.  Nerve Conduction Studies Anti Sensory Summary Table   Stim Site NR Peak (ms) Norm Peak (ms) P-T Amp (V)  Norm P-T Amp Site1 Site2 Dist (cm) Vel (m/s) Norm Vel (m/s)  Left Median Anti Sensory (2nd Digit)  Wrist NR  <3.6  >10 Wrist 2nd Digit 13.0  >39  Right Median Anti Sensory (2nd Digit)  Wrist NR  <3.6  >10 Wrist 2nd Digit 13.0  >39  Left Radial Anti Sensory (Base 1st Digit)  Wrist    2.2 <2.5 18.0  Wrist Base 1st Digit 0.0    Right Radial Anti Sensory (Base 1st Digit)  Wrist    2.6 <2.5 19.4  Wrist Base 1st Digit 0.0    Left Ulnar Anti Sensory (5th Digit)  Wrist NR  <3.7  >15.0 Wrist 5th Digit 12.0  >38  Right Ulnar Anti Sensory (5th Digit)  Wrist    4.4 <3.7 1.4 >15.0 Wrist 5th Digit 12.0 27 >38   Motor Summary Table   Stim Site NR Onset (ms) Norm Onset (ms) O-P Amp (mV) Norm O-P Amp Site1 Site2 Dist (cm) Vel (m/s) Norm Vel (m/s)  Left Median Motor (Abd Poll Brev)  Wrist NR  <4.5  >3       Right Median Motor (Abd Poll Brev)  Wrist NR  <4.5  >3       Left Ulnar Motor (Abd Dig Minimi)  Wrist    4.5 <3.6 1.6 >5 B Elbow Wrist 23.0 25 >48  B Elbow    13.8  0.0  A Elbow B Elbow 10.0 100 >48  A Elbow    14.8  1.3        Right Ulnar Motor (Abd Dig Minimi)  Wrist    4.1 <3.6 2.1 >5 B Elbow Wrist 23.0 64 >48  B Elbow    7.7  1.2  A Elbow B Elbow 10.0 19 >48  A Elbow    12.9  1.2         Comparison Summary Table   Stim Site NR Peak (ms) Norm Peak (ms) P-T Amp (V) Site1 Site2 Delta-P (ms) Norm Delta (ms)  Left Median/Ulnar Palm Comparison (Wrist - 8cm)  Median Palm NR  <2.5  Median Palm Ulnar Palm  <0.3  Ulnar Palm NR  <2.5       Right Median/Ulnar Palm Comparison (Wrist - 8cm)  Median Palm NR  <2.5  Median Palm Ulnar Palm  <0.3  Ulnar Palm NR  <2.5       Site 3 NR           Waveforms:

## 2023-11-23 NOTE — Progress Notes (Addendum)
 Referring Physician:  Cleotilde Bernett BIRCH, MD No address on file  Primary Physician:  Cleotilde Bernett BIRCH, MD  History of Present Illness: [name] has a history of CAD, RFA for cardiac arrhythmia, HTN, high cholesterol, OSA, macular degeneration.   Last seen by me on 10/02/23 for numbness and tingling in his hands along with intermittent LBP.   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   He is here to review his upper extremity EMG, lumbar xrays, and cervical MRI.   No change since his last visit.   He continues with constant numbness/tingling in both hands (mostly in thumb, pointer, and middle finger). He is dropping things. He has issues with hand dexterity. He has intermittent neck pain- it is tolerable. No radicular arm pain. He has some chronic balance issues.   He has more constant LBP with occasional left posterior leg pain to his knee. He has occasional weakness in left leg. He has numbness and tingling in his feet- neurontin helps this.   He does not smoke.   Bowel/Bladder Dysfunction: none  Conservative measures:  Physical therapy:  has done OT for hands and PT for his neck about 2 years ago with improvement.   Multimodal medical therapy including regular antiinflammatories: Gabapentin, Meloxicam  Injections: no epidural steroid injections  Past Surgery: no spinal surgeries  JONAEL PARADISO has some dexterity issues. He has chronic balance issues.   The symptoms are causing a significant impact on the patient's life.   Review of Systems:  A 10 point review of systems is negative, except for the pertinent positives and negatives detailed in the HPI.  Past  Medical History: Past Medical History:  Diagnosis Date   Cervical spinal stenosis    CPAP (continuous positive airway pressure) dependence    Hypertension    Lumbar stenosis    PVC's (premature ventricular contractions)    Vitamin D deficiency     Past Surgical History: Past Surgical History:  Procedure Laterality Date   CARDIAC ELECTROPHYSIOLOGY STUDY AND ABLATION     HEART CATH(UNKNOWN SIDE)     LEFT HIP REPLACEMENT     MELONOMA REMOVED     LEFT SIDE OF NECK   RIGHT ANKLE SURGERY     RIGHT HIP REPLACEMENT      Allergies: Allergies as of 12/04/2023 - Review Complete 10/02/2023  Allergen Reaction Noted   Rofecoxib Other (See Comments) 04/11/2012    Medications: Outpatient Encounter Medications as of 12/04/2023  Medication Sig   Alpha-Lipoic Acid 600 MG CAPS Take 600 mg by mouth daily.   amiodarone (PACERONE) 200 MG tablet Take 200 mg by mouth daily.   ascorbic acid (VITAMIN C) 500 MG tablet Take 500 mg by mouth daily.   ASPIRIN 81 PO Take 81 mg by mouth daily.   cetirizine (ZYRTEC) 10 MG tablet Take 1 tablet by mouth daily.   Cholecalciferol 125 MCG (5000 UT) TABS Take 5,000 Units by mouth daily.   Coenzyme Q10 10 MG capsule Take 10 mg by mouth daily.   CRANBERRY PO Take 2,500 mg by mouth daily.   cyanocobalamin (VITAMIN B12) 1000 MCG tablet Take 1,000 mcg by mouth daily.   furosemide (LASIX) 20 MG tablet Take 20 mg by mouth  daily.   Glucosamine-Chondroitin 250-200 MG TABS Take 2 tablets by mouth daily.   losartan (COZAAR) 25 MG tablet Take 25 mg by mouth daily.   lovastatin (MEVACOR) 40 MG tablet Take 2 tablets by mouth daily   Melatonin 12 MG TABS Take 1 tablet by mouth daily.   meloxicam (MOBIC) 7.5 MG tablet Take 7.5 mg by mouth in the morning and at bedtime.   metoprolol succinate (TOPROL-XL) 50 MG 24 hr tablet Take 50 mg by mouth daily.   mirtazapine (REMERON) 45 MG tablet Take 1 tablet by mouth at bedtime.   Multiple Vitamin (MULTIVITAMIN) tablet Take 1 tablet by  mouth daily.   Multiple Vitamins-Minerals (PRESERVISION AREDS PO) Apply 1 drop to eye daily.   potassium chloride (KLOR-CON M) 10 MEQ tablet Take 10 mEq by mouth daily.   Probiotic Product (PROBIOTIC ADVANCED PO) Take 1 tablet by mouth daily.   tamsulosin (FLOMAX) 0.4 MG CAPS capsule Take 1 capsule by mouth 2 (two) times daily.   No facility-administered encounter medications on file as of 12/04/2023.    Social History: Social History   Tobacco Use   Smoking status: Never   Smokeless tobacco: Never    Family Medical History: No family history on file.  Physical Examination: There were no vitals filed for this visit.    Awake, alert, oriented to person, place, and time.  Speech is clear and fluent. Fund of knowledge is appropriate.   Cranial Nerves: Pupils equal round and reactive to light.  Facial tone is symmetric.    No posterior cervical tenderness. No tenderness in bilateral trapezial region.   No abnormal lesions on exposed skin.   Strength: Side Biceps Triceps Deltoid Interossei Grip Wrist Ext. Wrist Flex.  R 5 5 5 5 5 5 5   L 5 5 5 5 5 5 5    Side Iliopsoas Quads Hamstring PF DF EHL  R 5 5 5 5 5 5   L 5 5 5 5 5 5    Reflexes are 2+ and symmetric at the biceps, brachioradialis, patella and achilles.   Hoffman's is absent.  Clonus is not present.   Bilateral upper and lower extremity sensation is intact to light touch.     No pain with IR/ER of both hips.   Gait is slow. He uses a walking stick.     Medical Decision Making  Imaging: Cervical MRI dated 11/12/23:  FINDINGS: Alignment: Chronic mild reversal of the normal cervical lordosis with trace anterolisthesis of C2 on C3 and C4 on C5, 3 mm retrolisthesis of C3 on C4, trace retrolisthesis of C5 on C6, and 4 mm anterolisthesis of C7 on T1, similar to the prior study. Grade 1 anterolisthesis of T2 on T3 and T3 on T4 as well.   Vertebrae: No fracture, suspicious marrow lesion, or evidence of discitis. Advanced  disc space narrowing throughout the cervical and upper thoracic spine (with exception of C2-3) associated with primarily chronic degenerative endplate changes.   Cord: Normal signal.   Posterior Fossa, vertebral arteries, paraspinal tissues: 8 mm T2 hyperintense subcutaneous nodule in the midline of the posterior upper neck at the C2 level, likely an epidermal inclusion cyst. Preserved vertebral artery flow voids. Unremarkable included posterior fossa.   Disc levels:   C2-3: Anterolisthesis with disc uncovering, a shallow left paracentral disc protrusion, and severe left facet arthrosis result in increased, mild left neural foraminal stenosis without spinal stenosis.   C3-4: Retrolisthesis with bulging of uncovered disc, a broad central disc protrusion, uncovertebral spurring,  and mild facet arthrosis result in increased, moderate to severe spinal stenosis with mild to moderate cord flattening and severe right greater than left neural foraminal stenosis.   C4-5: Anterolisthesis with right eccentric bulging of uncovered disc, uncovertebral spurring, and severe right facet arthrosis result in increased, moderate spinal stenosis with mild cord flattening and moderate right neural foraminal stenosis.   C5-6: A broad-based posterior disc osteophyte complex and mild facet arthrosis result in increased, moderate spinal stenosis with mild cord flattening and moderate right and severe left neural foraminal stenosis.   C6-7: A broad-based posterior disc osteophyte complex result in moderate spinal stenosis and severe bilateral neural foraminal stenosis, mildly progressed from prior.   C7-T1: Anterolisthesis with disc uncovering, uncovertebral spurring, and moderate to severe facet arthrosis result in mild right and moderate left neural foraminal stenosis without significant spinal stenosis, mildly progressed.   T1-2: Disc bulging eccentric to the right, endplate spurring,  and mild-to-moderate facet arthrosis result in moderate right neural foraminal stenosis without significant spinal stenosis, unchanged.   IMPRESSION: 1. 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 2. Moderate to severe spinal stenosis at C3-4 and moderate spinal stenosis at C4-5, C5-6, and C6-7. 3. Widespread severe neural foraminal stenosis as above.     Electronically Signed   By: Dasie Hamburg M.D.   On: 12/02/2023 11:29    Lumbar xrays dated 11/12/23:  FINDINGS: Similar numbering scheme as MRI 04/14/2016. 10 mm anterolisthesis L4 on L5, this measures 10 mm on flexion and 10 mm on extension. Chronic bilateral pars defect at L4. Vertebral body heights are maintained. Advanced disc space narrowing at L4-L5 with moderate disc space narrowing at L5-S1. Moderate disc space narrowing L1-L2. Prominent lower lumbar facet degenerative changes.   IMPRESSION: 1. 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 No significant motion on flexion or extension. 2. Multilevel degenerative changes, most advanced at L4-L5.     Electronically Signed   By: Luke Bun M.D.   On: 11/16/2023 22:50    I have personally reviewed the images and agree with the above interpretation.   EMG of bilateral upper extremities dated 11/12/23:  Impression: This is an abnormal electrodiagnostic study consistent with a generalized severe sensorimotor polyneuropathy, superimposed bilateral carpal tunnel syndrome, can't rule out superimposed bilateral ulnar neuropathies.  Clinical Correlation: Compared to last study, there is less suspicion of bilateral C8 active denervation.  Thank you for the referral of this patient. It was our privilege to participate in care of your patient. Feel free to contact us  with any further questions.  _____________________________ Jannett Fairly, M.D.    Assessment and Plan: Mr. Crean continues with constant numbness/tingling in both hands  (mostly in thumb, pointer, and middle finger). He is dropping things. He has issues with hand dexterity. He has intermittent neck pain- it is tolerable. No radicular arm pain. He has some chronic balance issues.   He has known diffuse cervical spondylosis with severe central stenosis C3-C4 and moderate/severe central stenosis C4-C7. He has slip C2-C3, C4-C5, C7-T1 with retrolisthesis C3-C4 and C5-C6. He also has multilevel foraminal stenosis.   EMG shows generalized severe sensorimotor polyneuropathy, superimposed bilateral carpal tunnel syndrome, can't rule out superimposed bilateral ulnar neuropathies. Compared to last study, there is less suspicion of bilateral C8 active denervation.  He has more constant LBP with occasional left posterior leg pain to his knee. He has occasional weakness in left leg. He has numbness and tingling in his feet- neurontin helps this.  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 No significant motion on flexion or extension.  Treatment options discussed with patient and following plan made:   - Discussed cervical stenosis at length. He has dexterity and balance issues. Upper extremity numbness and tingling may be due to this, but polyneuropathy and possible CTS/ulnar neuropathy may be contributing as well.  - Recommend he follow up with Dr. Clois to discuss further treatment options for his neck. Appointment scheduled.  - Would recommend treatment of cervical spine prior to any treatment of lumbar spine.   I spent a total of 35 minutes in face-to-face and non-face-to-face activities related to this patient's care today including review of outside records, review of imaging, review of symptoms, physical exam, discussion of differential diagnosis, discussion of treatment options, and documentation.   Glade Boys PA-C Dept. of Neurosurgery

## 2023-12-04 ENCOUNTER — Ambulatory Visit: Admitting: Orthopedic Surgery

## 2023-12-04 ENCOUNTER — Encounter: Payer: Self-pay | Admitting: Orthopedic Surgery

## 2023-12-04 VITALS — BP 122/64 | Ht 70.0 in | Wt 319.0 lb

## 2023-12-04 DIAGNOSIS — M4316 Spondylolisthesis, lumbar region: Secondary | ICD-10-CM | POA: Diagnosis not present

## 2023-12-04 DIAGNOSIS — M4722 Other spondylosis with radiculopathy, cervical region: Secondary | ICD-10-CM

## 2023-12-04 DIAGNOSIS — G5603 Carpal tunnel syndrome, bilateral upper limbs: Secondary | ICD-10-CM

## 2023-12-04 DIAGNOSIS — M47812 Spondylosis without myelopathy or radiculopathy, cervical region: Secondary | ICD-10-CM

## 2023-12-04 DIAGNOSIS — R2689 Other abnormalities of gait and mobility: Secondary | ICD-10-CM

## 2023-12-04 DIAGNOSIS — M47816 Spondylosis without myelopathy or radiculopathy, lumbar region: Secondary | ICD-10-CM | POA: Diagnosis not present

## 2023-12-04 DIAGNOSIS — M4802 Spinal stenosis, cervical region: Secondary | ICD-10-CM | POA: Diagnosis not present

## 2023-12-04 DIAGNOSIS — R200 Anesthesia of skin: Secondary | ICD-10-CM

## 2023-12-04 DIAGNOSIS — M5412 Radiculopathy, cervical region: Secondary | ICD-10-CM

## 2023-12-04 NOTE — Patient Instructions (Signed)
 It was so nice to see you today. Thank you so much for coming in.    You have moderate to severe spinal stenosis in your neck. This is likely causing the symptoms in your hands along with your balance issues.   I want you to follow up with Dr. Clois to discuss treatment options for this.   Your nerve test showed a neuropathy along with possible carpal tunnel and ulnar nerve issues.   Your lower back xrays show wear and tear with a slip at L4-L5.   I have attached copies of the reports. If your daughter has any questions prior to your follow up, let me know.   Please do not hesitate to call if you have any questions or concerns. You can also message me in MyChart.   Glade Boys PA-C [phone]     The physicians and staff at [facility] at [facility] are committed to providing excellent care. You may receive a survey asking for feedback about your experience at our office. We value you your feedback and appreciate you taking the time to to fill it out. The [facility] leadership team is also available to discuss your experience in person, feel free to contact us  [phone].

## 2023-12-17 DIAGNOSIS — E7800 Pure hypercholesterolemia, unspecified: Secondary | ICD-10-CM

## 2023-12-17 HISTORY — DX: Pure hypercholesterolemia, unspecified: E78.00

## 2023-12-17 NOTE — Progress Notes (Unsigned)
 Referring Physician:  Cleotilde Bernett BIRCH, MD No address on file  Primary Physician:  Bryan Bernett BIRCH, MD  History of Present Illness: 12/20/2023 Bryan Boyle is a 78 year old male with carpal tunnel syndrome and peripheral neuropathy who presents with numbness and tingling in his fingers and balance issues. He is accompanied by his wife, Bryan Boyle. He was referred by Bryan Boyle for evaluation of his neurological symptoms.  Numbness and tingling are present in the thumb and first two fingers of both hands, with the left hand affected first. Symptoms have persisted since at least 2018 and often disrupt sleep, requiring him to shake his hands. He uses gloves and adaptive devices, such as a vertical mouse, to manage symptoms during computer use. Previous nerve studies indicate carpal tunnel syndrome.  Severe peripheral neuropathy causes numbness on the tops of his feet, disrupting sleep. He applies Voltaren gel for burning and stinging sensations. Fine motor tasks, such as tying shoes and buttoning shirts, are challenging, though he uses a gadget to assist with buttoning.  He has peripheral vascular disease and spondylolisthesis, with 2017 imaging showing some lumbar stenosis. Prolonged standing causes pain, necessitating leaning against support. He has used a walking stick for over 12 years to aid balance and is cautious to avoid falls. No recent falls reported.     12/04/2023 Note from [facility], [place] [name] has a history of CAD, RFA for cardiac arrhythmia, HTN, high cholesterol, OSA, macular degeneration.    Last seen by me on 10/02/23 for numbness and tingling in his hands along with intermittent LBP.    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    He is here to review his upper extremity EMG, lumbar xrays, and cervical MRI.    No change since his last visit.    He continues with constant numbness/tingling in both hands (mostly in thumb, pointer, and middle finger). He is dropping things. He has issues with hand dexterity. He has intermittent neck pain- it is tolerable. No radicular arm pain. He has some chronic balance issues.    He has more constant LBP with occasional left posterior leg pain to his knee. He has occasional weakness in left leg. He has numbness and tingling in his feet- neurontin helps this.    He does not smoke.    Bowel/Bladder Dysfunction: none   Conservative measures:  Physical therapy:  has done OT for hands and PT for his neck about 2 years ago with improvement.   Multimodal medical therapy including regular antiinflammatories: Gabapentin, Meloxicam  Injections: no epidural steroid injections   Past Surgery: no spinal surgeries   Bryan Boyle has some dexterity issues. He has chronic balance issues.    The symptoms are causing a significant impact on the patient's life.   I have utilized the care everywhere function in epic to review the outside records available from external health systems.  Review of Systems:  A 10 point review of systems is negative, except for the pertinent positives and negatives detailed in the HPI.  Past Medical History: Past Medical History:  Diagnosis Date   Cervical spinal stenosis    CPAP (continuous positive airway pressure) dependence    Hypertension  Lumbar stenosis    PVC's (premature ventricular contractions)    Vitamin D deficiency     Past Surgical History: Past Surgical History:  Procedure Laterality Date   CARDIAC ELECTROPHYSIOLOGY STUDY AND ABLATION     HEART CATH(UNKNOWN SIDE)     LEFT HIP REPLACEMENT     MELONOMA REMOVED     LEFT SIDE OF NECK   RIGHT ANKLE SURGERY      RIGHT HIP REPLACEMENT      Allergies: Allergies as of 12/20/2023 - Review Complete 12/20/2023  Allergen Reaction Noted   Rofecoxib Other (See Comments) 04/11/2012    Medications:  Current Outpatient Medications:    Alpha-Lipoic Acid 600 MG CAPS, Take 600 mg by mouth daily., Disp: , Rfl:    ascorbic acid (VITAMIN C) 500 MG tablet, Take 500 mg by mouth daily., Disp: , Rfl:    ASPIRIN 81 PO, Take 81 mg by mouth daily., Disp: , Rfl:    cetirizine (ZYRTEC) 10 MG tablet, Take 1 tablet by mouth daily., Disp: , Rfl:    Cholecalciferol 125 MCG (5000 UT) TABS, Take 5,000 Units by mouth daily., Disp: , Rfl:    Coenzyme Q10 10 MG capsule, Take 10 mg by mouth daily., Disp: , Rfl:    CRANBERRY PO, Take 2,500 mg by mouth daily., Disp: , Rfl:    cyanocobalamin (VITAMIN B12) 1000 MCG tablet, Take 1,000 mcg by mouth daily., Disp: , Rfl:    fluticasone (FLONASE) 50 MCG/ACT nasal spray, Place 2 sprays into both nostrils 2 (two) times daily., Disp: , Rfl:    furosemide (LASIX) 20 MG tablet, Take 20 mg by mouth daily., Disp: , Rfl:    furosemide (LASIX) 20 MG tablet, Take 40 mg by mouth daily., Disp: , Rfl:    gabapentin (NEURONTIN) 400 MG capsule, Take 400 mg by mouth 2 (two) times daily., Disp: , Rfl:    Glucosamine-Chondroitin 250-200 MG TABS, Take 2 tablets by mouth daily., Disp: , Rfl:    losartan (COZAAR) 25 MG tablet, Take 25 mg by mouth daily., Disp: , Rfl:    Melatonin 12 MG TABS, Take 1 tablet by mouth daily., Disp: , Rfl:    meloxicam (MOBIC) 7.5 MG tablet, Take 7.5 mg by mouth in the morning and at bedtime., Disp: , Rfl:    metoprolol succinate (TOPROL-XL) 25 MG 24 hr tablet, Take 1 tablet by mouth daily., Disp: , Rfl:    mirtazapine (REMERON) 45 MG tablet, Take 1 tablet by mouth at bedtime., Disp: , Rfl:    Multiple Vitamin (MULTIVITAMIN) tablet, Take 1 tablet by mouth daily., Disp: , Rfl:    Multiple Vitamins-Minerals (PRESERVISION AREDS PO), Apply 1 drop to eye daily., Disp: , Rfl:     potassium chloride (KLOR-CON M) 10 MEQ tablet, Take 10 mEq by mouth daily., Disp: , Rfl:    Probiotic Product (PROBIOTIC ADVANCED PO), Take 1 tablet by mouth daily., Disp: , Rfl:    rosuvastatin (CRESTOR) 20 MG tablet, Take 20 mg by mouth at bedtime., Disp: , Rfl:    sertraline (ZOLOFT) 100 MG tablet, Take 100 mg by mouth daily., Disp: , Rfl:    spironolactone (ALDACTONE) 25 MG tablet, Take 1 tablet by mouth daily., Disp: , Rfl:    tamsulosin (FLOMAX) 0.4 MG CAPS capsule, Take 1 capsule by mouth 2 (two) times daily., Disp: , Rfl:    traMADol (ULTRAM) 50 MG tablet, Take 50 mg by mouth every 8 (eight) hours as needed., Disp: , Rfl:   Social History: Social  History   Tobacco Use   Smoking status: Former    Types: Cigarettes   Smokeless tobacco: Never  Substance Use Topics   Alcohol use: Not Currently   Drug use: Never    Family Medical History: No family history on file.  Physical Examination: Vitals:   12/20/23 1042  BP: 130/64    General: Patient is in no apparent distress. Attention to examination is appropriate.  Neck:   Supple.  Full range of motion.  Respiratory: Patient is breathing without any difficulty.   NEUROLOGICAL:     Awake, alert, oriented to person, place, and time.  Speech is clear and fluent.   Cranial Nerves: Pupils equal round and reactive to light.  Facial tone is symmetric.  Facial sensation is symmetric. Shoulder shrug is symmetric. Tongue protrusion is midline.  There is no pronator drift.  Strength: Side Biceps Triceps Deltoid Interossei Grip Wrist Ext. Wrist Flex.  R 5 5 5 4 4  4+ 5  L 5 5 5 4 4 5 5    Side Iliopsoas Quads Hamstring PF DF EHL  R 5 5 5 5 5 5   L 5 5 5 5 5 5    Reflexes are 1+ and symmetric at the biceps, triceps, brachioradialis, patella and achilles.   Hoffman's is absent.   Bilateral upper and lower extremity sensation is intact to light touch.    No evidence of dysmetria noted.  Gait requires a cane and is antalgic.      Medical Decision Making  Imaging: MRI C spine 11/12/2023 Disc levels:   C2-3: Anterolisthesis with disc uncovering, a shallow left paracentral disc protrusion, and severe left facet arthrosis result in increased, mild left neural foraminal stenosis without spinal stenosis.   C3-4: Retrolisthesis with bulging of uncovered disc, a broad central disc protrusion, uncovertebral spurring, and mild facet arthrosis result in increased, moderate to severe spinal stenosis with mild to moderate cord flattening and severe right greater than left neural foraminal stenosis.   C4-5: Anterolisthesis with right eccentric bulging of uncovered disc, uncovertebral spurring, and severe right facet arthrosis result in increased, moderate spinal stenosis with mild cord flattening and moderate right neural foraminal stenosis.   C5-6: A broad-based posterior disc osteophyte complex and mild facet arthrosis result in increased, moderate spinal stenosis with mild cord flattening and moderate right and severe left neural foraminal stenosis.   C6-7: A broad-based posterior disc osteophyte complex result in moderate spinal stenosis and severe bilateral neural foraminal stenosis, mildly progressed from prior.   C7-T1: Anterolisthesis with disc uncovering, uncovertebral spurring, and moderate to severe facet arthrosis result in mild right and moderate left neural foraminal stenosis without significant spinal stenosis, mildly progressed.   T1-2: Disc bulging eccentric to the right, endplate spurring, and mild-to-moderate facet arthrosis result in moderate right neural foraminal stenosis without significant spinal stenosis, unchanged.   IMPRESSION: 1. 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 2. Moderate to severe spinal stenosis at C3-4 and moderate spinal stenosis at C4-5, C5-6, and C6-7. 3. Widespread severe neural foraminal stenosis as above.     Electronically Signed   By:  Dasie Hamburg M.D.   On: 12/02/2023 11:29  I have personally reviewed the images and agree with the above interpretation.  EMG 11/12/2023 Impression: This is an abnormal electrodiagnostic study consistent with a generalized severe sensorimotor polyneuropathy, superimposed bilateral carpal tunnel syndrome, can't rule out superimposed bilateral ulnar neuropathies.   Clinical Correlation: Compared to last study, there is less suspicion of bilateral C8 active denervation.  Thank you for the referral of this patient. It was our privilege to participate in care of your patient.  Feel free to contact us  with any further questions.     _____________________________ Jannett Fairly, M.D.  Assessment and Plan: Mr. Dubie is a pleasant 78 y.o. male with carpal tunnel syndrome as well as cervical stenosis.  He has some muscle wasting in his hands.  He is having low back pain with sciatica as well.  I have recommended that he move forward first with carpal tunnel intervention.  Will set up an appointment with Dr. Claudene.  Severe Peripheral Neuropathy Severe neuropathy with foot numbness and burning, contributing to hand symptoms. Chronic condition, may persist post-surgery. - Discuss management with current provider. - Consider spinal cord stimulator trial if symptoms persist post-surgery.  Spinal Stenosis Cervical stenosis with nerve root and cord compression. Symptoms include balance issues and hand numbness. Surgery deferred to assess post-carpal tunnel improvement. - Reevaluate cervical spine intervention post-carpal tunnel surgery.  Spondylolisthesis and Spondylosis Lumbar spine involvement with stenosis, causing back pain and standing difficulty. - Encourage weight loss to reduce surgical risk.  I spent a total of 30 minutes in this patient's care today. This time was spent reviewing pertinent records including imaging studies, obtaining and confirming history, performing a directed  evaluation, formulating and discussing my recommendations, and documenting the visit within the medical record.      Thank you for involving me in the care of this patient.      Treyshawn Muldrew K. Clois MD, [facility]

## 2023-12-20 ENCOUNTER — Ambulatory Visit (INDEPENDENT_AMBULATORY_CARE_PROVIDER_SITE_OTHER): Admitting: Neurosurgery

## 2023-12-20 ENCOUNTER — Encounter: Payer: Self-pay | Admitting: Neurosurgery

## 2023-12-20 VITALS — BP 130/64 | Ht 70.0 in | Wt 324.0 lb

## 2023-12-20 DIAGNOSIS — M4802 Spinal stenosis, cervical region: Secondary | ICD-10-CM

## 2023-12-20 DIAGNOSIS — G5603 Carpal tunnel syndrome, bilateral upper limbs: Secondary | ICD-10-CM | POA: Diagnosis not present

## 2023-12-20 DIAGNOSIS — M4316 Spondylolisthesis, lumbar region: Secondary | ICD-10-CM | POA: Diagnosis not present

## 2023-12-20 DIAGNOSIS — M47812 Spondylosis without myelopathy or radiculopathy, cervical region: Secondary | ICD-10-CM

## 2023-12-20 DIAGNOSIS — G629 Polyneuropathy, unspecified: Secondary | ICD-10-CM

## 2023-12-20 NOTE — Patient Instructions (Signed)

## 2024-01-08 NOTE — H&P (View-Only) (Signed)
 Referring Physician:  Cleotilde Bernett BIRCH, MD No address on file  Primary Physician:  Bryan Bernett BIRCH, MD  History of Present Illness: 01/09/2024 [name] is here today with a chief complaint of bilateral carpal tunnel syndrome.  He has been dealing with the right hand for over 10 years in the left hand for over 5 years.  He has used bracing.  He has not tried injections.  He has been followed for neck issues for the same amount of time.  He has not been offered surgery due to risk factors.  He states that his left hand is even more bothersome than his right even though it started more recently.  He just gets more of the numbness and tingling that wakes him up every night.  Review of Systems:  A 10 point review of systems is negative, except for the pertinent positives and negatives detailed in the HPI.  Past Medical History: Past Medical History:  Diagnosis Date   Cervical spinal stenosis    CPAP (continuous positive airway pressure) dependence    Hypertension    Lumbar stenosis    PVC's (premature ventricular contractions)    Vitamin D deficiency     Past Surgical History: Past Surgical History:  Procedure Laterality Date   CARDIAC ELECTROPHYSIOLOGY STUDY AND ABLATION     HEART CATH(UNKNOWN SIDE)     LEFT HIP REPLACEMENT     MELONOMA REMOVED     LEFT SIDE OF NECK   RIGHT ANKLE SURGERY     RIGHT HIP REPLACEMENT      Allergies: Allergies as of 01/09/2024 - Review Complete 01/09/2024  Allergen Reaction Noted   Rofecoxib Other (See Comments) 04/11/2012    Medications:  Current Outpatient Medications:    Alpha-Lipoic Acid 600 MG CAPS, 2 daily, Disp: , Rfl:    ascorbic acid (VITAMIN C) 500 MG tablet, Take 500 mg by mouth daily., Disp: , Rfl:    ASPIRIN 81 PO, Take 81 mg by mouth daily., Disp: , Rfl:    cetirizine (ZYRTEC) 10 MG tablet, Take 1 tablet by mouth daily., Disp: , Rfl:    Cholecalciferol 125 MCG (5000 UT) TABS, Take 5,000 Units by mouth daily.,  Disp: , Rfl:    clobetasol cream (TEMOVATE) 0.05 %, Apply 1 Application topically 2 (two) times daily as needed., Disp: , Rfl:    Coenzyme Q10 10 MG capsule, Take 10 mg by mouth daily., Disp: , Rfl:    CRANBERRY PO, Take 2,500 mg by mouth daily., Disp: , Rfl:    cyanocobalamin (VITAMIN B12) 1000 MCG tablet, Take 1,000 mcg by mouth daily., Disp: , Rfl:    furosemide (LASIX) 40 MG tablet, Take 40 mg by mouth daily., Disp: , Rfl:    gabapentin (NEURONTIN) 400 MG capsule, Take 400 mg by mouth 2 (two) times daily., Disp: , Rfl:    losartan (COZAAR) 25 MG tablet, Take 25 mg by mouth daily., Disp: , Rfl:    Magnesium 250 MG TABS, Take by mouth daily., Disp: , Rfl:    Melatonin 12 MG TABS, Take 1 tablet by mouth daily., Disp: , Rfl:    meloxicam (MOBIC) 7.5 MG tablet, Take 7.5 mg by mouth in the morning and at bedtime., Disp: , Rfl:    metoprolol succinate (TOPROL-XL) 25 MG 24 hr tablet, Take 1 tablet by mouth daily., Disp: , Rfl:    mirtazapine (REMERON) 45 MG tablet, Take 1 tablet by mouth at bedtime., Disp: , Rfl:    Multiple  Vitamin (MULTIVITAMIN) tablet, Take 1 tablet by mouth daily., Disp: , Rfl:    Multiple Vitamins-Minerals (PRESERVISION AREDS PO), Take by mouth. PRESERVISION 2 DAILY, Disp: , Rfl:    OVER THE COUNTER MEDICATION, OSTEO BIFLEX, PROBIOTIC AND VITAMIN D COMBO 1 A DAY, Disp: , Rfl:    Polyethyl Glycol-Propyl Glycol (SYSTANE) 0.4-0.3 % SOLN, Apply to eye as needed., Disp: , Rfl:    rosuvastatin (CRESTOR) 20 MG tablet, Take 20 mg by mouth at bedtime., Disp: , Rfl:    sertraline (ZOLOFT) 100 MG tablet, Take 100 mg by mouth daily., Disp: , Rfl:    spironolactone (ALDACTONE) 25 MG tablet, Take 1 tablet by mouth daily., Disp: , Rfl:    tamsulosin (FLOMAX) 0.4 MG CAPS capsule, Take 1 capsule by mouth 2 (two) times daily., Disp: , Rfl:    traMADol (ULTRAM) 50 MG tablet, Take 50 mg by mouth every 8 (eight) hours as needed., Disp: , Rfl:   Social History: Social History   Tobacco Use    Smoking status: Former    Types: Cigarettes   Smokeless tobacco: Never  Substance Use Topics   Alcohol use: Not Currently   Drug use: Never    Family Medical History: No family history on file.  Physical Examination: Vitals:   01/09/24 0952  BP: 116/76    General: Patient is in no apparent distress. Attention to examination is appropriate.  Neck:   Supple.  Full range of motion.  Respiratory: Patient is breathing without any difficulty.   NEUROLOGICAL:     Awake, alert, oriented to person, place, and time.  Speech is clear and fluent.   Cranial Nerves: Pupils equal round and reactive to light.  Facial tone is symmetric. Shoulder shrug is symmetric. Tongue protrusion is midline.  There is no pronator drift.  Motor Exam:  Significant thenar wasting bilaterally.  Unable do abduction nor opposition of the thumb on either hand.  Has decree sensation in the median distribution.  Positive carpal compression test.  Positive Phalen's test.    Medical Decision Making  Imaging: Narrative & Impression  CLINICAL DATA:  Cervical radiculopathy. Cervical spinal stenosis. Chronic neck pain. Chronic numbness and tingling in the hands, worst in the index and long fingers.   EXAM: MRI CERVICAL SPINE WITHOUT CONTRAST   TECHNIQUE: Multiplanar, multisequence MR imaging of the cervical spine was performed. No intravenous contrast was administered.   COMPARISON:  Cervical spine MRI 05/20/2017   FINDINGS: Alignment: Chronic mild reversal of the normal cervical lordosis with trace anterolisthesis of C2 on C3 and C4 on C5, 3 mm retrolisthesis of C3 on C4, trace retrolisthesis of C5 on C6, and 4 mm anterolisthesis of C7 on T1, similar to the prior study. Grade 1 anterolisthesis of T2 on T3 and T3 on T4 as well.   Vertebrae: No fracture, suspicious marrow lesion, or evidence of discitis. Advanced disc space narrowing throughout the cervical and upper thoracic spine (with exception of  C2-3) associated with primarily chronic degenerative endplate changes.   Cord: Normal signal.   Posterior Fossa, vertebral arteries, paraspinal tissues: 8 mm T2 hyperintense subcutaneous nodule in the midline of the posterior upper neck at the C2 level, likely an epidermal inclusion cyst. Preserved vertebral artery flow voids. Unremarkable included posterior fossa.   Disc levels:   C2-3: Anterolisthesis with disc uncovering, a shallow left paracentral disc protrusion, and severe left facet arthrosis result in increased, mild left neural foraminal stenosis without spinal stenosis.   C3-4: Retrolisthesis with bulging of uncovered  disc, a broad central disc protrusion, uncovertebral spurring, and mild facet arthrosis result in increased, moderate to severe spinal stenosis with mild to moderate cord flattening and severe right greater than left neural foraminal stenosis.   C4-5: Anterolisthesis with right eccentric bulging of uncovered disc, uncovertebral spurring, and severe right facet arthrosis result in increased, moderate spinal stenosis with mild cord flattening and moderate right neural foraminal stenosis.   C5-6: A broad-based posterior disc osteophyte complex and mild facet arthrosis result in increased, moderate spinal stenosis with mild cord flattening and moderate right and severe left neural foraminal stenosis.   C6-7: A broad-based posterior disc osteophyte complex result in moderate spinal stenosis and severe bilateral neural foraminal stenosis, mildly progressed from prior.   C7-T1: Anterolisthesis with disc uncovering, uncovertebral spurring, and moderate to severe facet arthrosis result in mild right and moderate left neural foraminal stenosis without significant spinal stenosis, mildly progressed.   T1-2: Disc bulging eccentric to the right, endplate spurring, and mild-to-moderate facet arthrosis result in moderate right neural foraminal stenosis without  significant spinal stenosis, unchanged.   IMPRESSION: 1. 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 2. Moderate to severe spinal stenosis at C3-4 and moderate spinal stenosis at C4-5, C5-6, and C6-7. 3. Widespread severe neural foraminal stenosis as above.     Electronically Signed   By: Dasie Hamburg M.D.   On: 12/02/2023 11:29      Electrodiagnostics: Jannett Mickie Fairly, MD Physician Specialty: Neurology   Procedures    Signed   Encounter Date: 11/12/2023 Note Received: 01/09/2024  9:52 AM   Signed     Test Date:  11/12/2023   Patient: Bryan Boyle DOB: 09-May-1946 Physician: Dr. Jannett Fairly  Chart#: [id number] Sex: Male Ref Phys: Glade Boys    Patient History: Patient is a 78 year-old male who presents with bilateral hand numbness and tingling.  Denies neck pain but does have stiffness.  He is an Advertising account planner on the computer.  Past medical history is significant for cancer.    Exam:  Manual muscle testing revealed bilateral thenar and intrinsic hand muscle atrophy.  Sensation testing revealed decreased sensation to left hand in both median distributions.   EMG & NCV Findings: Evaluation of the Left median motor, the Right median motor, the Left median sensory, the Right median sensory, and the Left ulnar sensory nerves showed no response (Wrist).  The Left ulnar motor nerve showed prolonged distal onset latency (4.5 ms), reduced amplitude (1.6 mV), and decreased conduction velocity (B Elbow-Wrist, 25 m/s).  The Right ulnar motor nerve showed prolonged distal onset latency (4.1 ms), reduced amplitude (2.1 mV), and decreased conduction velocity (A Elbow-B Elbow, 19 m/s).  The Right radial sensory nerve showed prolonged distal peak latency (2.6 ms).  The Right ulnar sensory nerve showed prolonged distal peak latency (4.4 ms), reduced amplitude (1.4 V), and decreased conduction velocity (Wrist-5th Digit, 27 m/s).  The Left median/ulnar (palm) comparison nerve showed  no response (Median Palm) and no response (Ulnar Palm).  The Right median/ulnar (palm) comparison nerve showed no response (Median Palm), no response (Ulnar Palm), and no response (Site 3).  All remaining nerves (as indicated in the following tables) were within normal limits.     EMG    Side Muscle Nerve Root Ins Act Fibs Psw Amp Dur Poly Recrt Int Bruna Comment  Left Abd Poll Brev Median C8-T1 Nml Nml Nml Incr >77ms 2+ moderate Reduced Nml    Left 1stDorInt Ulnar C8-T1 Nml Nml Nml Incr >47ms  2+ moderate Reduced Nml    Left ExtDigCom Radial (Post Int) C7-8 Nml Nml Nml Incr >56ms 1+ mild Reduced Nml    Left ABD Dig Min Ulnar C8-T1 Nml Nml Nml Incr >22ms 1+ mild Reduced Nml    Left PronatorTeres Median C6-7 Nml Nml Nml Nml Nml 0 Nml Nml    Left Biceps Musculocut C5-6 Nml Nml Nml Nml Nml 0 Nml Nml    Left Triceps Radial C6-7-8 Nml Nml Nml Nml Nml 0 Nml Nml    Left Deltoid Axillary C5-6 Nml Nml Nml Nml Nml 0 Nml Nml    Left Cervical Parasp Up Rami C1-3 Nml Nml Nml              Left Cervical Parasp Mid Rami C4-6 Nml Nml Nml              Left Cervical Parasp Low Rami C7-8 Nml Nml Nml              Right Abd Poll Brev Median C8-T1 Nml Nml Nml Incr >87ms 2+ moderate Reduced Nml    Right 1stDorInt Ulnar C8-T1 Nml Nml Nml Incr >65ms 2+ moderate Reduced Nml    Right ExtDigCom Radial (Post Int) C7-8 Nml Nml Nml Incr >64ms 1+ mild Reduced Nml    Right ABD Dig Min Ulnar C8-T1 Nml Nml Nml Incr >40ms 1+ mild Reduced Nml    Right PronatorTeres Median C6-7 Nml Nml Nml Nml Nml 0 Nml Nml    Right Biceps Musculocut C5-6 Nml Nml Nml Nml Nml 0 Nml Nml    Right Triceps Radial C6-7-8 Nml Nml Nml Nml Nml 0 Nml Nml    Right Deltoid Axillary C5-6 Nml Nml Nml Nml Nml 0 Nml Nml    Right Cervical Parasp Up Rami C1-3 Nml Nml Nml              Right Cervical Parasp Mid Rami C4-6 Nml Nml Nml              Right Cervical Parasp Low Rami C7-8 Nml Nml Nml                  Impression: This is an abnormal electrodiagnostic study  consistent with a generalized severe sensorimotor polyneuropathy, superimposed bilateral carpal tunnel syndrome, can't rule out superimposed bilateral ulnar neuropathies.   Clinical Correlation: Compared to last study, there is less suspicion of bilateral C8 active denervation.     Thank you for the referral of this patient. It was our privilege to participate in care of your patient.  Feel free to contact us  with any further questions.     _____________________________ Jannett Fairly, M.D.          I have personally reviewed the images and electrodiagnostics and agree with the above interpretation.  Assessment and Plan: Bryan Boyle is a pleasant 78 y.o. male with bilateral carpal tunnel syndrome.  His left is more bothersome than his right.  He wakes him up nightly.  He has severe weakness in his median nerve distribution.  He has wasting bilaterally in the thenar compartments.  EMG and nerve conduction studies demonstrate significant median neuropathy at the wrist bilaterally.  He has possible ulnar neuropathy with very mild ulnar intrinsic weakness.  He has no ulnar sensation issues.  At this point I feel like his most bothersome complaint is his median neuropathy bilaterally.  Left worse than right.  Will plan on going forward with a left-sided ultrasound-guided carpal tunnel release given his history.  He  has positive EMG findings, symptoms of 5 and 10 years respectively, has failed conservative therapy and has wasting with severe EMG findings.. The symptoms are causing a significant impact on the patient's life. I have utilized the care everywhere function in epic to review the outside records available from external health systems, and have personally reviewed relevant imaging and electrodiagnostic workup.    Thank you for involving me in the care of this patient.    Penne MICAEL Sharps MD/MSCR Neurosurgery - Peripheral Nerve Surgery

## 2024-01-08 NOTE — Progress Notes (Unsigned)
 Referring Physician:  Cleotilde Bernett BIRCH, MD No address on file  Primary Physician:  Bryan Bernett BIRCH, MD  History of Present Illness: 01/09/2024 [name] is here today with a chief complaint of bilateral carpal tunnel syndrome.  He has been dealing with the right hand for over 10 years in the left hand for over 5 years.  He has used bracing.  He has not tried injections.  He has been followed for neck issues for the same amount of time.  He has not been offered surgery due to risk factors.  He states that his left hand is even more bothersome than his right even though it started more recently.  He just gets more of the numbness and tingling that wakes him up every night.  Review of Systems:  A 10 point review of systems is negative, except for the pertinent positives and negatives detailed in the HPI.  Past Medical History: Past Medical History:  Diagnosis Date   Cervical spinal stenosis    CPAP (continuous positive airway pressure) dependence    Hypertension    Lumbar stenosis    PVC's (premature ventricular contractions)    Vitamin D deficiency     Past Surgical History: Past Surgical History:  Procedure Laterality Date   CARDIAC ELECTROPHYSIOLOGY STUDY AND ABLATION     HEART CATH(UNKNOWN SIDE)     LEFT HIP REPLACEMENT     MELONOMA REMOVED     LEFT SIDE OF NECK   RIGHT ANKLE SURGERY     RIGHT HIP REPLACEMENT      Allergies: Allergies as of 01/09/2024 - Review Complete 01/09/2024  Allergen Reaction Noted   Rofecoxib Other (See Comments) 04/11/2012    Medications:  Current Outpatient Medications:    Alpha-Lipoic Acid 600 MG CAPS, 2 daily, Disp: , Rfl:    ascorbic acid (VITAMIN C) 500 MG tablet, Take 500 mg by mouth daily., Disp: , Rfl:    ASPIRIN 81 PO, Take 81 mg by mouth daily., Disp: , Rfl:    cetirizine (ZYRTEC) 10 MG tablet, Take 1 tablet by mouth daily., Disp: , Rfl:    Cholecalciferol 125 MCG (5000 UT) TABS, Take 5,000 Units by mouth daily.,  Disp: , Rfl:    clobetasol cream (TEMOVATE) 0.05 %, Apply 1 Application topically 2 (two) times daily as needed., Disp: , Rfl:    Coenzyme Q10 10 MG capsule, Take 10 mg by mouth daily., Disp: , Rfl:    CRANBERRY PO, Take 2,500 mg by mouth daily., Disp: , Rfl:    cyanocobalamin (VITAMIN B12) 1000 MCG tablet, Take 1,000 mcg by mouth daily., Disp: , Rfl:    furosemide (LASIX) 40 MG tablet, Take 40 mg by mouth daily., Disp: , Rfl:    gabapentin (NEURONTIN) 400 MG capsule, Take 400 mg by mouth 2 (two) times daily., Disp: , Rfl:    losartan (COZAAR) 25 MG tablet, Take 25 mg by mouth daily., Disp: , Rfl:    Magnesium 250 MG TABS, Take by mouth daily., Disp: , Rfl:    Melatonin 12 MG TABS, Take 1 tablet by mouth daily., Disp: , Rfl:    meloxicam (MOBIC) 7.5 MG tablet, Take 7.5 mg by mouth in the morning and at bedtime., Disp: , Rfl:    metoprolol succinate (TOPROL-XL) 25 MG 24 hr tablet, Take 1 tablet by mouth daily., Disp: , Rfl:    mirtazapine (REMERON) 45 MG tablet, Take 1 tablet by mouth at bedtime., Disp: , Rfl:    Multiple  Vitamin (MULTIVITAMIN) tablet, Take 1 tablet by mouth daily., Disp: , Rfl:    Multiple Vitamins-Minerals (PRESERVISION AREDS PO), Take by mouth. PRESERVISION 2 DAILY, Disp: , Rfl:    OVER THE COUNTER MEDICATION, OSTEO BIFLEX, PROBIOTIC AND VITAMIN D COMBO 1 A DAY, Disp: , Rfl:    Polyethyl Glycol-Propyl Glycol (SYSTANE) 0.4-0.3 % SOLN, Apply to eye as needed., Disp: , Rfl:    rosuvastatin (CRESTOR) 20 MG tablet, Take 20 mg by mouth at bedtime., Disp: , Rfl:    sertraline (ZOLOFT) 100 MG tablet, Take 100 mg by mouth daily., Disp: , Rfl:    spironolactone (ALDACTONE) 25 MG tablet, Take 1 tablet by mouth daily., Disp: , Rfl:    tamsulosin (FLOMAX) 0.4 MG CAPS capsule, Take 1 capsule by mouth 2 (two) times daily., Disp: , Rfl:    traMADol (ULTRAM) 50 MG tablet, Take 50 mg by mouth every 8 (eight) hours as needed., Disp: , Rfl:   Social History: Social History   Tobacco Use    Smoking status: Former    Types: Cigarettes   Smokeless tobacco: Never  Substance Use Topics   Alcohol use: Not Currently   Drug use: Never    Family Medical History: No family history on file.  Physical Examination: Vitals:   01/09/24 0952  BP: 116/76    General: Patient is in no apparent distress. Attention to examination is appropriate.  Neck:   Supple.  Full range of motion.  Respiratory: Patient is breathing without any difficulty.   NEUROLOGICAL:     Awake, alert, oriented to person, place, and time.  Speech is clear and fluent.   Cranial Nerves: Pupils equal round and reactive to light.  Facial tone is symmetric. Shoulder shrug is symmetric. Tongue protrusion is midline.  There is no pronator drift.  Motor Exam:  Significant thenar wasting bilaterally.  Unable do abduction nor opposition of the thumb on either hand.  Has decree sensation in the median distribution.  Positive carpal compression test.  Positive Phalen's test.    Medical Decision Making  Imaging: Narrative & Impression  CLINICAL DATA:  Cervical radiculopathy. Cervical spinal stenosis. Chronic neck pain. Chronic numbness and tingling in the hands, worst in the index and long fingers.   EXAM: MRI CERVICAL SPINE WITHOUT CONTRAST   TECHNIQUE: Multiplanar, multisequence MR imaging of the cervical spine was performed. No intravenous contrast was administered.   COMPARISON:  Cervical spine MRI 05/20/2017   FINDINGS: Alignment: Chronic mild reversal of the normal cervical lordosis with trace anterolisthesis of C2 on C3 and C4 on C5, 3 mm retrolisthesis of C3 on C4, trace retrolisthesis of C5 on C6, and 4 mm anterolisthesis of C7 on T1, similar to the prior study. Grade 1 anterolisthesis of T2 on T3 and T3 on T4 as well.   Vertebrae: No fracture, suspicious marrow lesion, or evidence of discitis. Advanced disc space narrowing throughout the cervical and upper thoracic spine (with exception of  C2-3) associated with primarily chronic degenerative endplate changes.   Cord: Normal signal.   Posterior Fossa, vertebral arteries, paraspinal tissues: 8 mm T2 hyperintense subcutaneous nodule in the midline of the posterior upper neck at the C2 level, likely an epidermal inclusion cyst. Preserved vertebral artery flow voids. Unremarkable included posterior fossa.   Disc levels:   C2-3: Anterolisthesis with disc uncovering, a shallow left paracentral disc protrusion, and severe left facet arthrosis result in increased, mild left neural foraminal stenosis without spinal stenosis.   C3-4: Retrolisthesis with bulging of uncovered  disc, a broad central disc protrusion, uncovertebral spurring, and mild facet arthrosis result in increased, moderate to severe spinal stenosis with mild to moderate cord flattening and severe right greater than left neural foraminal stenosis.   C4-5: Anterolisthesis with right eccentric bulging of uncovered disc, uncovertebral spurring, and severe right facet arthrosis result in increased, moderate spinal stenosis with mild cord flattening and moderate right neural foraminal stenosis.   C5-6: A broad-based posterior disc osteophyte complex and mild facet arthrosis result in increased, moderate spinal stenosis with mild cord flattening and moderate right and severe left neural foraminal stenosis.   C6-7: A broad-based posterior disc osteophyte complex result in moderate spinal stenosis and severe bilateral neural foraminal stenosis, mildly progressed from prior.   C7-T1: Anterolisthesis with disc uncovering, uncovertebral spurring, and moderate to severe facet arthrosis result in mild right and moderate left neural foraminal stenosis without significant spinal stenosis, mildly progressed.   T1-2: Disc bulging eccentric to the right, endplate spurring, and mild-to-moderate facet arthrosis result in moderate right neural foraminal stenosis without  significant spinal stenosis, unchanged.   IMPRESSION: 1. 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 2. Moderate to severe spinal stenosis at C3-4 and moderate spinal stenosis at C4-5, C5-6, and C6-7. 3. Widespread severe neural foraminal stenosis as above.     Electronically Signed   By: Dasie Hamburg M.D.   On: 12/02/2023 11:29      Electrodiagnostics: Jannett Mickie Fairly, MD Physician Specialty: Neurology   Procedures    Signed   Encounter Date: 11/12/2023 Note Received: 01/09/2024  9:52 AM   Signed     Test Date:  11/12/2023   Patient: Bryan Boyle DOB: 09-May-1946 Physician: Dr. Jannett Fairly  Chart#: [id number] Sex: Male Ref Phys: Glade Boys    Patient History: Patient is a 78 year-old male who presents with bilateral hand numbness and tingling.  Denies neck pain but does have stiffness.  He is an Advertising account planner on the computer.  Past medical history is significant for cancer.    Exam:  Manual muscle testing revealed bilateral thenar and intrinsic hand muscle atrophy.  Sensation testing revealed decreased sensation to left hand in both median distributions.   EMG & NCV Findings: Evaluation of the Left median motor, the Right median motor, the Left median sensory, the Right median sensory, and the Left ulnar sensory nerves showed no response (Wrist).  The Left ulnar motor nerve showed prolonged distal onset latency (4.5 ms), reduced amplitude (1.6 mV), and decreased conduction velocity (B Elbow-Wrist, 25 m/s).  The Right ulnar motor nerve showed prolonged distal onset latency (4.1 ms), reduced amplitude (2.1 mV), and decreased conduction velocity (A Elbow-B Elbow, 19 m/s).  The Right radial sensory nerve showed prolonged distal peak latency (2.6 ms).  The Right ulnar sensory nerve showed prolonged distal peak latency (4.4 ms), reduced amplitude (1.4 V), and decreased conduction velocity (Wrist-5th Digit, 27 m/s).  The Left median/ulnar (palm) comparison nerve showed  no response (Median Palm) and no response (Ulnar Palm).  The Right median/ulnar (palm) comparison nerve showed no response (Median Palm), no response (Ulnar Palm), and no response (Site 3).  All remaining nerves (as indicated in the following tables) were within normal limits.     EMG    Side Muscle Nerve Root Ins Act Fibs Psw Amp Dur Poly Recrt Int Bruna Comment  Left Abd Poll Brev Median C8-T1 Nml Nml Nml Incr >77ms 2+ moderate Reduced Nml    Left 1stDorInt Ulnar C8-T1 Nml Nml Nml Incr >47ms  2+ moderate Reduced Nml    Left ExtDigCom Radial (Post Int) C7-8 Nml Nml Nml Incr >56ms 1+ mild Reduced Nml    Left ABD Dig Min Ulnar C8-T1 Nml Nml Nml Incr >22ms 1+ mild Reduced Nml    Left PronatorTeres Median C6-7 Nml Nml Nml Nml Nml 0 Nml Nml    Left Biceps Musculocut C5-6 Nml Nml Nml Nml Nml 0 Nml Nml    Left Triceps Radial C6-7-8 Nml Nml Nml Nml Nml 0 Nml Nml    Left Deltoid Axillary C5-6 Nml Nml Nml Nml Nml 0 Nml Nml    Left Cervical Parasp Up Rami C1-3 Nml Nml Nml              Left Cervical Parasp Mid Rami C4-6 Nml Nml Nml              Left Cervical Parasp Low Rami C7-8 Nml Nml Nml              Right Abd Poll Brev Median C8-T1 Nml Nml Nml Incr >87ms 2+ moderate Reduced Nml    Right 1stDorInt Ulnar C8-T1 Nml Nml Nml Incr >65ms 2+ moderate Reduced Nml    Right ExtDigCom Radial (Post Int) C7-8 Nml Nml Nml Incr >64ms 1+ mild Reduced Nml    Right ABD Dig Min Ulnar C8-T1 Nml Nml Nml Incr >40ms 1+ mild Reduced Nml    Right PronatorTeres Median C6-7 Nml Nml Nml Nml Nml 0 Nml Nml    Right Biceps Musculocut C5-6 Nml Nml Nml Nml Nml 0 Nml Nml    Right Triceps Radial C6-7-8 Nml Nml Nml Nml Nml 0 Nml Nml    Right Deltoid Axillary C5-6 Nml Nml Nml Nml Nml 0 Nml Nml    Right Cervical Parasp Up Rami C1-3 Nml Nml Nml              Right Cervical Parasp Mid Rami C4-6 Nml Nml Nml              Right Cervical Parasp Low Rami C7-8 Nml Nml Nml                  Impression: This is an abnormal electrodiagnostic study  consistent with a generalized severe sensorimotor polyneuropathy, superimposed bilateral carpal tunnel syndrome, can't rule out superimposed bilateral ulnar neuropathies.   Clinical Correlation: Compared to last study, there is less suspicion of bilateral C8 active denervation.     Thank you for the referral of this patient. It was our privilege to participate in care of your patient.  Feel free to contact us  with any further questions.     _____________________________ Jannett Fairly, M.D.          I have personally reviewed the images and electrodiagnostics and agree with the above interpretation.  Assessment and Plan: Bryan Boyle is a pleasant 78 y.o. male with bilateral carpal tunnel syndrome.  His left is more bothersome than his right.  He wakes him up nightly.  He has severe weakness in his median nerve distribution.  He has wasting bilaterally in the thenar compartments.  EMG and nerve conduction studies demonstrate significant median neuropathy at the wrist bilaterally.  He has possible ulnar neuropathy with very mild ulnar intrinsic weakness.  He has no ulnar sensation issues.  At this point I feel like his most bothersome complaint is his median neuropathy bilaterally.  Left worse than right.  Will plan on going forward with a left-sided ultrasound-guided carpal tunnel release given his history.  He  has positive EMG findings, symptoms of 5 and 10 years respectively, has failed conservative therapy and has wasting with severe EMG findings.. The symptoms are causing a significant impact on the patient's life. I have utilized the care everywhere function in epic to review the outside records available from external health systems, and have personally reviewed relevant imaging and electrodiagnostic workup.    Thank you for involving me in the care of this patient.    Penne MICAEL Sharps MD/MSCR Neurosurgery - Peripheral Nerve Surgery

## 2024-01-09 ENCOUNTER — Other Ambulatory Visit: Payer: Self-pay

## 2024-01-09 ENCOUNTER — Ambulatory Visit: Admitting: Neurosurgery

## 2024-01-09 VITALS — BP 116/76 | Ht 70.0 in | Wt 324.0 lb

## 2024-01-09 DIAGNOSIS — Z01818 Encounter for other preprocedural examination: Secondary | ICD-10-CM

## 2024-01-09 DIAGNOSIS — G5603 Carpal tunnel syndrome, bilateral upper limbs: Secondary | ICD-10-CM | POA: Diagnosis not present

## 2024-01-09 DIAGNOSIS — G5613 Other lesions of median nerve, bilateral upper limbs: Secondary | ICD-10-CM | POA: Diagnosis not present

## 2024-01-09 DIAGNOSIS — G5602 Carpal tunnel syndrome, left upper limb: Secondary | ICD-10-CM

## 2024-01-09 HISTORY — DX: Carpal tunnel syndrome, bilateral upper limbs: G56.03

## 2024-01-09 NOTE — Progress Notes (Deleted)
 Saw stern in 2019, neck issues worried about htat. But was having right handissues ever seince that.   Didn't want to do surgeyr for then.   Left side just started last few years.   Has a lot of intrinsing hand wasting.    Right hand is getting worse but less botherson.   Gtripper  ribber bands.   Wakes him up at night.   Groans about it.   - injeictions, crapalt tunnel braces, compression gloves.    Left hand tin gles the most    Left hand first.

## 2024-01-09 NOTE — Patient Instructions (Signed)
 Please see below for information in regards to your upcoming surgery:   Planned surgery: Left carpal tunnel release with ultrasound guidance   Surgery date: 02/05/24 at [facility] (Medical Mall: [address], [place], [place] [address]) - you will find out your arrival time the business day before your surgery.   Pre-op appointment at [facility] Pre-admit Testing: you will receive a call with a date/time for this appointment. If you are scheduled for an in person appointment, Pre-admit Testing is located on the first floor of the [facility] building, [address], [address]. During this appointment, they will advise you which medications you can take the morning of surgery, and which medications you will need to hold for surgery. Labs (such as blood work, EKG) may be done at your pre-op appointment. You are not required to fast for these labs. Should you need to change your pre-op appointment, please call Pre-admit testing at [phone].     Blood thinners:   Aspirin 81mg :  OK to stay on aspirin 81mg       Surgical clearance: we will send a clearance form to Lynwood Crouch, MD (cardiology). They may wish to see you in their office prior to signing the clearance form. If so, they may call you to schedule an appointment.      How to contact us :  If you have any questions/concerns before or after surgery, you can reach us  at [phone], or you can send a mychart message. We can be reached by phone or mychart 8am-4pm, Monday-Friday.  *Please note: Calls after 4pm are forwarded to a third party answering service. Mychart messages are not routinely monitored during evenings, weekends, and holidays. Please call our office to contact the answering service for urgent concerns during non-business hours.    If you have FMLA/disability paperwork, please drop it off or fax it to [phone]   Appointments/FMLA & disability paperwork: Reche &  Ritta Registered Nurse/Surgery scheduler: Maizey Menendez, RN Certified Medical Assistants: Don, CMA, Elenor, CMA, & Damien, CMA Physician Assistants: Lyle Decamp, PA-C, Edsel Goods, PA-C & Glade Boys, PA-C Surgeons: Penne Sharps, MD & Reeves Daisy, MD   [facility] PREADMIT TESTING VISIT and SURGERY INFORMATION SHEET   Now that surgery has been scheduled you can anticipate several phone calls from [facility] services. A pharmacy technician will call you to verify your current list of medications taken at home.               The Pre-Service Center will call to verify your insurance information and to give you billing estimates and information.             The Preadmit Testing Office will be calling to schedule a visit to obtain information for the anesthesia team and provide instructions on preparation for surgery.  What can you expect for the Preadmit Testing Visit: Appointments may be scheduled in-person or by telephone.  If a telephone visit is scheduled, you may be asked to come into the office to have lab tests or other studies performed.   This visit will not be completed any greater than 14 days prior to your surgery.  If your surgery has been scheduled for a future date, please do not be alarmed if we have not contacted you to schedule an appointment more than a month prior to the surgery date.    Please be prepared to provide the following information during this appointment:            -  Personal medical history                                               -Medication and allergy list            -Any history of problems with anesthesia              -Recent lab work or diagnostic studies            -Please notify us  of any needs we should be aware of to provide the best care possible           -You will be provided with instructions on how to prepare for your surgery.    On The Day of Surgery:  You must have a driver to take you home after  surgery, you will be asked not to drive for 24 hours following surgery.  Taxi, Gisele and non-medical transport will not be acceptable means of transportation unless you have a responsible individual who will be traveling with you.  Visitors in the surgical area:   2 people will be able to visit you in your room once your preparation for surgery has been completed. During surgery, your visitors will be asked to wait in the Surgery Waiting Area.  It is not a requirement for them to stay, if they prefer to leave and come back.  Your visitor(s) will be given an update once the surgery has been completed.  No visitors are allowed in the initial recovery room to respect patient privacy and safety.  Once you are more awake and transfer to the secondary recovery area, or are transferred to an inpatient room, visitors will again be able to see you.  To respect and protect your privacy: We will ask on the day of surgery who your driver will be and what the contact number for that individual will be. We will ask if it is okay to share information with this individual, or if there is an alternative individual that we, or the surgeon, should contact to provide updates and information. If family or friends come to the surgical information desk requesting information about you, who you have not listed with us , no information will be given.   It may be helpful to designate someone as the main contact who will be responsible for updating your other friends and family.    PREADMIT TESTING OFFICE: [phone] SAME DAY SURGERY: [phone] We look forward to caring for you before and throughout the process of your surgery.

## 2024-01-28 ENCOUNTER — Inpatient Hospital Stay: Admission: RE | Admit: 2024-01-28 | Source: Ambulatory Visit

## 2024-01-28 ENCOUNTER — Other Ambulatory Visit: Payer: Self-pay

## 2024-01-28 ENCOUNTER — Encounter
Admission: RE | Admit: 2024-01-28 | Discharge: 2024-01-28 | Disposition: A | Discharge status: Home / Self Care | Source: Ambulatory Visit | Attending: Neurosurgery | Admitting: Neurosurgery

## 2024-01-28 HISTORY — DX: Prediabetes: R73.03

## 2024-01-28 HISTORY — DX: Sleep apnea, unspecified: G47.30

## 2024-01-28 NOTE — Patient Instructions (Addendum)
 Your procedure is scheduled on:  TUESDAY  SEPTEMBER 2  Report to the Registration Desk on the 1st floor of the [facility] To find out your arrival time, please call [phone] between 1PM - 3PM on:   FRIDAY AUGUST 29  If your arrival time is 6:00 am, do not arrive before that time as the [facility] entrance doors do not open until 6:00 am.  REMEMBER: Instructions that are not followed completely may result in serious medical risk, up to and including death; or upon the discretion of your surgeon and anesthesiologist your surgery may need to be rescheduled.  Do not eat food after midnight the night before surgery.  No gum chewing or hard candies.  You may however, drink CLEAR liquids up to 2 hours before you are scheduled to arrive for your surgery. Do not drink anything within 2 hours of your scheduled arrival time.  Clear liquids include: - water  - apple juice without pulp - gatorade (not RED colors) - black coffee or tea (Do NOT add milk or creamers to the coffee or tea) Do NOT drink anything that is not on this list.  One week prior to surgery: Stop Anti-inflammatories (NSAIDS) such as Advil, Aleve, Ibuprofen, Motrin, Naproxen, Naprosyn and Aspirin based products such as Excedrin, Goody's Powder, BC Powder. Stop ANY OVER THE COUNTER supplements until after surgery. Alpha-Lipoic Acid  ascorbic acid (VITAMIN C)  cetirizine (ZYRTEC)  Cholecalciferol  Coenzyme Q10  CRANBERRY  cyanocobalamin (VITAMIN B12)  Magnesium  Multiple Vitamin (MULTIVITAMIN)  Multiple Vitamins-Minerals (PRESERVISION AREDS PO)  OSTEO BIFLEX   You may however, continue to take Tylenol if needed for pain up until the day of surgery. meloxicam (MOBIC) hold 7 days prior to surgery, last dose SUNDAY AUGUST 24   Continue taking all of your other prescription medications up until the day of surgery.  ON THE DAY OF SURGERY ONLY TAKE THESE MEDICATIONS WITH SIPS OF WATER:  metoprolol succinate  (TOPROL-XL)   No Alcohol for 24 hours before or after surgery.  Do not use any recreational drugs for at least a week (preferably 2 weeks) before your surgery.  Please be advised that the combination of cocaine and anesthesia may have negative outcomes, up to and including death. If you test positive for cocaine, your surgery will be cancelled.  On the morning of surgery brush your teeth with toothpaste and water, you may rinse your mouth with mouthwash if you wish. Do not swallow any toothpaste or mouthwash.  Use CHG Soap as directed on instruction sheet.  Do not wear jewelry, make-up, hairpins, clips or nail polish.  For welded (permanent) jewelry: bracelets, anklets, waist bands, etc.  Please have this removed prior to surgery.  If it is not removed, there is a chance that hospital personnel will need to cut it off on the day of surgery.  Do not wear lotions, powders, or perfumes.   Do not shave body hair from the neck down 48 hours before surgery.  Contact lenses, hearing aids and dentures may not be worn into surgery.  Do not bring valuables to the hospital. [facility] is not responsible for any missing/lost belongings or valuables.   Notify your doctor if there is any change in your medical condition (cold, fever, infection).  Wear comfortable clothing (specific to your surgery type) to the hospital.  After surgery, you can help prevent lung complications by doing breathing exercises.  Take deep breaths and cough every 1-2 hours.   If you  are being discharged the day of surgery, you will not be allowed to drive home. You will need a responsible individual to drive you home and stay with you for 24 hours after surgery.   If you are taking public transportation, you will need to have a responsible individual with you.  Please call the Pre-admissions Testing Dept. at [phone] if you have any questions about these instructions.  Surgery Visitation Policy:  Patients  having surgery or a procedure may have two visitors.  Children under the age of 31 must have an adult with them who is not the patient.   Merchandiser, retail to address health-related social needs:  [email or web address]                                                                                                            Preparing for Surgery with CHLORHEXIDINE GLUCONATE (CHG) Soap  Chlorhexidine Gluconate (CHG) Soap  o An antiseptic cleaner that kills germs and bonds with the skin to continue killing germs even after washing  o Used for showering the night before surgery and morning of surgery  Before surgery, you can play an important role by reducing the number of germs on your skin.  CHG (Chlorhexidine gluconate) soap is an antiseptic cleanser which kills germs and bonds with the skin to continue killing germs even after washing.  Please do not use if you have an allergy to CHG or antibacterial soaps. If your skin becomes reddened/irritated stop using the CHG.  1. Shower the NIGHT BEFORE SURGERY and the MORNING OF SURGERY with CHG soap.  2. If you choose to wash your hair, wash your hair first as usual with your normal shampoo.  3. After shampooing, rinse your hair and body thoroughly to remove the shampoo.  4. Use CHG as you would any other liquid soap. You can apply CHG directly to the skin and wash gently with a scrungie or a clean washcloth.  5. Apply the CHG soap to your body only from the neck down. Do not use on open wounds or open sores. Avoid contact with your eyes, ears, mouth, and genitals (private parts). Wash face and genitals (private parts) with your normal soap.  6. Wash thoroughly, paying special attention to the area where your surgery will be performed.  7. Thoroughly rinse your body with warm water.  8. Do not shower/wash with your normal soap after using and rinsing off the CHG soap.  9. Pat yourself dry with a clean towel.  10.  Wear clean pajamas to bed the night before surgery.  11. Place clean sheets on your bed the night of your first shower and do not sleep with pets.  12. Shower again with the CHG soap on the day of surgery prior to arriving at the hospital.  13. Do not apply any deodorants/lotions/powders.  14. Please wear clean clothes to the hospital.

## 2024-02-01 ENCOUNTER — Encounter: Payer: Self-pay | Admitting: Neurosurgery

## 2024-02-01 NOTE — Progress Notes (Signed)
 Perioperative / Anesthesia Services  Pre-Admission Testing Clinical Review / Pre-Operative Anesthesia Consult  Date: 02/01/24  PATIENT DEMOGRAPHICS: Name: Bryan Boyle DOB: [birth year] MRN:   [id number]  Note: Available PAT nursing documentation and vital signs have been reviewed. Clinical nursing staff has updated patient's PMH/PSHx, current medication list, and drug allergies/intolerances to ensure complete and comprehensive history available to assist care teams in MDM as it pertains to the aforementioned surgical procedure and anticipated anesthetic course. Extensive review of available clinical information personally performed. [place] PMH and PSHx updated with any diagnoses/procedures that  may have been inadvertently omitted during his intake with the pre-admission testing department's nursing staff.  PLANNED SURGICAL PROCEDURE(S):   Case: [id number] Date/Time: 02/05/24 0758   Procedure: CARPAL TUNNEL RELEASE (Left) - LEFT CARPAL TUNNEL RELEASE WITH ULTRASOUND GUIDANCE   Anesthesia type: Monitor Anesthesia Care   Diagnosis: Left carpal tunnel syndrome [G56.02]   Pre-op diagnosis: G56.02 Left carpal tunnel syndrome   Location: [facility] OR ROOM 03 / [facility] ORS FOR ANEST[facility]ES[facility]   Surgeons: Claudene Penne LELON, MD        CLINICAL DISCUSSION: Bryan Boyle is a 78 y.o. male who is submitted for pre-surgical anesthesia review and clearance prior to him undergoing the above procedure. Patient is a Former Smoker Pertinent PMH includes: dilated nonischemic cardiomyopathy, diastolic dysfunction, cardiac murmur, PVCs (s/p RFA ablation), HTN, HLD, prediabetes, DOE, OSAH (requires nocturnal PAP therapy) BPH, OA, cervical and lumbar stenosis, carpal tunnel syndrome, insomnia.  Patient is followed by cardiology Gomez, MD). He was last seen in the cardiology clinic on 01/30/2024; notes reviewed. At the time of his clinic visit, patient doing well overall from a cardiovascular perspective. Patient  with peripheral edema what was reported to be mild and overall stable. Patient denied any chest pain, shortness of breath, PND, orthopnea, palpitations, weakness, fatigue, vertiginous symptoms, or presyncope/syncope. Patient with a past medical history significant for cardiovascular diagnoses. Documented physical exam was grossly benign, providing no evidence of acute exacerbation and/or decompensation of the patient's known cardiovascular conditions.  Patient underwent cardiac MRI on 10/30/2019. Study demonstrated moderate left ventricular enlargement with moderate to severe hypokinesis. EF moderately reduced at 38%. Right ventricular size and function normal; RVEF 51%. Moderate to severe biatrial enlargement was observed. Trivial to mild mitral, tricuspid, and pulmonary valve regurgitation noted. Delayed enhancement  imaging for viability mildly abnormal.There is linear mid-myocardial hyperenhancement involving the basal anteroseptum, which is generally felt to be non-specific. That said, enhancement pattern can also be seen in DCM. The pattern raises the possibility of a genetic cardiomyopathy such as lamin cardiomyopathy). No evidence of LA/LAA thrombus. Trivial pericardial effusion present.   Patient experiencing symptomatic PVCs with noted episodes of NSVT. He underwent cardiac catheterization for radiofrequency ablation on 01/26/2020.   Most recent long term cardiac event monitor was performed on 07/18/2023 revealing a predominant underlying sinus rhythm at an average rate of 62 bpm; range 41-93 bpm. There was a total of 1,913 PVCs, accounting for a 1% study burden. A total of 67 PACs also noted accounting for < 1% of the study. There were no sustained arrhythmias or prolonged pauses. Patient triggered events (6) corresponded to PVCs.   Most recent TTE performed on 10/31/2023 revealed a normal left ventricular systolic function with an EF of >55%. There was global hypokinesis. Left ventricular  diastolic Doppler parameters consistent with abnormal relaxation (G1DD). Right ventricular size and function normal with a TAPSE measuring 2.3 cm  (normal range >/= 1.6 cm). There was trivial  tricuspid valve regurgitation.  All transvalvular gradients were noted to be normal providing no evidence of hemodynamically significant valvular stenosis. Aorta normal in size with no evidence of ectasia or aneurysmal dilatation.  Dilated non-ischemic cardiomyopathy being managed on GDMT including prescribed diuretic (furosemide + spironolactone), ARB (losartan), and beta-blocker (metoprolol succinate) therapies; blood pressure reasonably controlled at 132/74 mmHg. Patient is on rosuvastatin for his HLD diagnosis and ASCVD prevention. Patient has a prediabetes diagnosis that he is managing with diet and lifestyle modification efforts. Most recent HgbA1c was 5/6% when checked on 07/27/2022. He does have an OSAH diagnosis and is reported to be compliant with prescribed nocturnal PAP therapy. Patient is able to complete all of his  ADL/IADLs without cardiovascular limitation.  Per the [facility], patient is able to achieve at least 4 METS of physical activity without experiencing any significant degree of angina/anginal equivalent symptoms. No changes were made to his medication regimen during his visit with cardiology.  Patient scheduled to follow-up with outpatient cardiology in 9 months or sooner if needed.  Bryan Boyle is scheduled for an elective CARPAL TUNNEL RELEASE (Left) on 02/05/2024 with Dr. Penne LELON Sharps, MD. Given patient's past medical history significant for cardiovascular diagnoses, presurgical cardiac clearance was sought by the PAT team.  Per cardiology, he is asymptomatic and requires no further testing or treatment changes before the planned surgery. Continue medications through surgery.  In review of the patient's chart, it is noted that he is on daily oral antithrombotic therapy. Given that patient's  past medical history is significant for cardiovascular diagnoses, neurosurgery has cleared patient to continue his daily low dose ASA throughout his perioperative course.  Patient has been updated on these directives from his specialty care providers by the PAT team.  Patient denies previous perioperative complications with anesthesia in the past. In review his EMR, it is noted that patient underwent a general anesthetic course at [facility] ([facility]) in 08/2022 without documented complications.   MOST RECENT VITAL SIGNS:    01/28/2024   10:52 AM 01/09/2024    9:52 AM 12/20/2023   10:42 AM  Vitals with BMI  Height 5' 10 5' 10 5' 10  Weight 310 lbs 324 lbs 324 lbs  BMI 44.48 46.49 46.49  Systolic  116 130  Diastolic  76 64   PROVIDERS/SPECIALISTS: NOTE: Primary physician provider listed below. Patient may have been seen by APP or partner within same practice.   PROVIDER ROLE / SPECIALTY LAST OV  Sharps Penne LELON, MD Neurosurgery (Surgeon) 12/20/2023  Cleotilde Bernett BIRCH, MD Primary Care Provider 01/29/2024  Hershal Agent, MD Cardiology 01/30/2024  Maree Hila, MD Neurology 11/12/2023   ALLERGIES: Allergies  Allergen Reactions   Rofecoxib Other (See Comments)    GERD  Brand name Vioxx, taken off the market due to heart valve side effects    CURRENT HOME MEDICATIONS: No current facility-administered medications for this encounter.    Alpha-Lipoic Acid 600 MG CAPS   ascorbic acid (VITAMIN C) 500 MG tablet   ASPIRIN 81 PO   cetirizine (ZYRTEC) 10 MG tablet   Cholecalciferol 125 MCG (5000 UT) TABS   clobetasol cream (TEMOVATE) 0.05 %   Coenzyme Q10 10 MG capsule   CRANBERRY PO   cyanocobalamin (VITAMIN B12) 1000 MCG tablet   furosemide (LASIX) 40 MG tablet   gabapentin (NEURONTIN) 400 MG capsule   losartan (COZAAR) 25 MG tablet   Magnesium 250 MG TABS   Melatonin 12 MG TABS   meloxicam (MOBIC)  7.5 MG tablet   metoprolol succinate (TOPROL-XL) 25 MG 24  hr tablet   mirtazapine (REMERON) 45 MG tablet   Multiple Vitamin (MULTIVITAMIN) tablet   Multiple Vitamins-Minerals (PRESERVISION AREDS PO)   OVER THE COUNTER MEDICATION   Polyethyl Glycol-Propyl Glycol (SYSTANE) 0.4-0.3 % SOLN   rosuvastatin (CRESTOR) 20 MG tablet   sertraline (ZOLOFT) 100 MG tablet   spironolactone (ALDACTONE) 25 MG tablet   tamsulosin (FLOMAX) 0.4 MG CAPS capsule   traMADol (ULTRAM) 50 MG tablet   HISTORY: Past Medical History:  Diagnosis Date   Bilateral carpal tunnel syndrome 01/09/2024   BPH (benign prostatic hyperplasia)    Cardiac murmur    Cardiomyopathy, dilated, nonischemic (HCC)    Carpal tunnel syndrome    Central scotoma, 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   Cervical spinal stenosis    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   Chronic pain syndrome 01/19/2023   Cobalamin deficiency 05/19/2013   Diastolic dysfunction    DOE (dyspnea on exertion)    Erectile dysfunction 06/07/2012   Hypercholesterolemia 12/17/2023   Hypertension    Insomnia    a.) uses melatonin PRN   Long-term use of aspirin therapy    Lumbar stenosis    Macular degeneration    Melanoma in situ (HCC) 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   Motion sickness    NSVT (nonsustained ventricular tachycardia) (HCC)    a.) s/p RFA ablation 01/26/2020   OSA on CPAP    Osteoarthritis of both knees 11/01/2012   Pre-diabetes    PVC's (premature ventricular contractions)    Seasonal allergic rhinitis 06/07/2012   Vitamin D deficiency    Past Surgical History:  Procedure Laterality Date   CARDIAC ELECTROPHYSIOLOGY STUDY AND ABLATION N/A 01/26/2020   LEFT HEART CATH Left 01/26/2020   MELONOMA REMOVED     LEFT SIDE OF NECK   RIGHT ANKLE SURGERY     TOTAL HIP ARTHROPLASTY Bilateral     Social History   Tobacco Use   Smoking status: Former    Types: Cigarettes   Smokeless tobacco: Never  Substance Use Topics   Alcohol use: Not Currently   LABS:  Component Ref Range & Units 01/30/2024  WBC (White Blood Cell  Count) 3.2 - 9.8 x10^9/L 11.8 High   Hemoglobin 13.7 - 17.3 g/dL 86.3 Low   Hematocrit 60.9 - 49.0 % 39.6  Platelets 150 - 450 x10^9/L 242  MCV (Mean Corpuscular Volume) 80 - 98 fL 96  MCH (Mean Corpuscular Hemoglobin) 26.5 - 34.0 pg 32.9  MCHC (Mean Corpuscular Hemoglobin Concentration) 31.5 - 36.3 % 34.3  RBC (Red Blood Cell Count) 4.37 - 5.74 x10^12/L 4.13 Low   RDW-CV (Red Cell Distribution Width) 11.5 - 14.5 % 12.7  NRBC (Nucleated Red Blood Cell Count) <0.00 x10^9/L 0.00  NRBC % (Nucleated Red Blood Cell %) % 0.0  MPV (Mean Platelet Volume) 7.2 - 11.7 fL 11.1  Neutrophil Count 2.0 - 8.6 x10^9/L 6.9  Neutrophil % 37.0 - 80.0 % 58.4  Lymphocyte Count 0.6 - 4.2 x10^9/L 3.3  Lymphocyte % 10.0 - 50.0 % 28.0  Monocyte Count 0.0 - 0.9 x10^9/L 1.2 High   Monocyte % 0.0 - 12.0 % 10.5  Eosinophil Count 0.00 - 0.70 x10^9/L 0.22  Eosinophil % 0.0 - 7.0 % 1.9  Basophil Count 0.00 - 0.20 x10^9/L 0.06  Basophil % 0.0 - 2.0 % 0.5  Immature Granulocyte Count <=0.06 x10^9/L 0.08 High   Immature Granulocyte % <=0.7 % 0.7   Component Ref Range & Units 01/30/2024  Sodium 135 -  145 mmol/L 139  Potassium 3.5 - 5.0 mmol/L 4.1  Chloride 98 - 108 mmol/L 107  Carbon Dioxide (CO2) 21 - 30 mmol/L 22  Urea Nitrogen (BUN) 7 - 20 mg/dL 15  Creatinine 0.6 - 1.3 mg/dL 1.1  Glucose 70 - 859 mg/dL 896  Calcium 8.7 - 89.7 mg/dL 9.4  AST (Aspartate Aminotransferase) 15 - 41 U/L 27  ALT (Alanine Aminotransferase) 15 - 50 U/L 27  Bilirubin, Total 0.4 - 1.5 mg/dL 1.0  Alk Phos (Alkaline Phosphatase) 24 - 110 U/L 72  Albumin 3.5 - 4.8 g/dL 3.8  Protein, Total 6.2 - 8.1 g/dL 7.3  Anion Gap 3 - 12 mmol/L 10  BUN/CREA Ratio 6 - 27 13  Glomerular Filtration Rate (eGFR) mL/min/1.73sq m 69    ECG: Date: 07/06/2023  Time ECG obtained: 1454 PM Rate: 70 bpm Rhythm: normal sinus Axis (leads I and aVF): normal Intervals: PR 184 ms. QRS 88 ms. QTc 421 ms. ST segment and  T wave changes: Nonspecific ST depression Evidence of a possible, age undetermined, prior infarct:  No Comparison: Previous tracing obtained on 07/04/2022 showed sinus rhythm with PACs with aberration NOTE: Tracing obtained at [facility]; unable for review. Above based on cardiologist's interpretation.    IMAGING / PROCEDURES: MR CERVICAL SPINE WO CONTRAST performed on 11/12/2023 Progressive advanced cervical disc and facet degeneration. Moderate to severe spinal stenosis at C3-4 and moderate spinal stenosis at C4-5, C5-6, and C6-7. Widespread severe neural foraminal stenosis as above.  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 No significant motion on flexion or extension. Multilevel degenerative changes, most advanced at L4-L5.  TRANSTHORACIC ECHOCARDIOGRAM performed on 10/31/2023 Normal left ventricular systolic function with an EF of >55% No LVH or regional wall motion abnormalities Left ventricular diastolic Doppler parameters consistent with abnormal relaxation (G1DD). Normal right ventricular size and function Trivial TR Normal gradients; no valvular stenosis  LONG TERM CARDIAC EVENT MONITOR STUDY performed on 07/18/2023 The predominant rhythm was  Sinus.  The Maximum Heart Rate recorded was 93 bpm, the Minimum Heart Rate recorded was 41 bpm, and the Average Heart Rate was 62 bpm. There were 1,913 VE beats with a burden of 1%.  There were 67 SVE beats with a burden of <1%.  There were 6 Patient Triggers.   IMPRESSION AND PLAN: Bryan Boyle has been referred for pre-anesthesia review and clearance prior to him undergoing the planned anesthetic and procedural courses. Available labs, pertinent testing, and imaging results were personally reviewed by me in preparation for upcoming operative/procedural course. [facility] medical record has been updated following extensive record review and patient interview  with PAT staff.   This patient has been appropriately cleared by cardiology with an overall ACCEPTABLE risk of patient experiencing significant perioperative cardiovascular complications. Based on clinical review performed today (02/01/24), barring any significant acute changes in the patient's overall condition, it is anticipated that he will be able to proceed with the planned surgical intervention. Any acute changes in clinical condition may necessitate his procedure being postponed and/or cancelled. Patient will meet with anesthesia team (MD and/or CRNA) on the day of his procedure for preoperative evaluation/assessment. Questions regarding anesthetic course will be fielded at that time.   Pre-surgical instructions were reviewed with the patient during his PAT appointment, and questions were fielded to satisfaction by PAT clinical staff. He has been instructed on which medications that he will need to hold prior to surgery,  as well as the ones that have been deemed safe/appropriate to take on the day of his procedure. As part of the general education provided by PAT, patient made aware both verbally and in writing, that he would need to abstain from the use of any illegal substances during his perioperative course. He was advised that failure to follow the provided instructions could necessitate case cancellation or result in serious perioperative complications up to and including death. Patient encouraged to contact PAT and/or his surgeon's office to discuss any questions or concerns that may arise prior to surgery; verbalized understanding.   Dorise Pereyra, MSN, APRN, FNP-C, CEN [facility]  Perioperative Services Nurse Practitioner Phone: [phone] Fax: [phone] 02/01/24 9:38 AM  NOTE: This note has been prepared using Dragon dictation software. Despite my best ability to proofread, there is always the potential that unintentional transcriptional errors may still occur  from this process.

## 2024-02-03 ENCOUNTER — Encounter: Payer: Self-pay | Admitting: Neurosurgery

## 2024-02-05 ENCOUNTER — Encounter: Payer: Self-pay | Admitting: Neurosurgery

## 2024-02-05 ENCOUNTER — Ambulatory Visit: Payer: Self-pay | Admitting: Urgent Care

## 2024-02-05 ENCOUNTER — Ambulatory Visit
Admission: RE | Admit: 2024-02-05 | Discharge: 2024-02-05 | Disposition: A | Discharge status: Home / Self Care | Attending: Neurosurgery | Admitting: Neurosurgery

## 2024-02-05 ENCOUNTER — Other Ambulatory Visit: Payer: Self-pay

## 2024-02-05 ENCOUNTER — Encounter
Admission: RE | Disposition: A | Discharge status: Home / Self Care | Payer: Self-pay | Source: Home / Self Care | Attending: Neurosurgery

## 2024-02-05 DIAGNOSIS — I10 Essential (primary) hypertension: Secondary | ICD-10-CM | POA: Diagnosis not present

## 2024-02-05 DIAGNOSIS — I493 Ventricular premature depolarization: Secondary | ICD-10-CM | POA: Insufficient documentation

## 2024-02-05 DIAGNOSIS — I472 Ventricular tachycardia, unspecified: Secondary | ICD-10-CM | POA: Diagnosis not present

## 2024-02-05 DIAGNOSIS — I420 Dilated cardiomyopathy: Secondary | ICD-10-CM | POA: Diagnosis not present

## 2024-02-05 DIAGNOSIS — G4733 Obstructive sleep apnea (adult) (pediatric): Secondary | ICD-10-CM | POA: Insufficient documentation

## 2024-02-05 DIAGNOSIS — G5602 Carpal tunnel syndrome, left upper limb: Secondary | ICD-10-CM | POA: Insufficient documentation

## 2024-02-05 DIAGNOSIS — E7800 Pure hypercholesterolemia, unspecified: Secondary | ICD-10-CM | POA: Diagnosis not present

## 2024-02-05 DIAGNOSIS — Z87891 Personal history of nicotine dependence: Secondary | ICD-10-CM | POA: Insufficient documentation

## 2024-02-05 DIAGNOSIS — N400 Enlarged prostate without lower urinary tract symptoms: Secondary | ICD-10-CM | POA: Diagnosis not present

## 2024-02-05 DIAGNOSIS — E66813 Obesity, class 3: Secondary | ICD-10-CM | POA: Diagnosis not present

## 2024-02-05 DIAGNOSIS — Z01818 Encounter for other preprocedural examination: Secondary | ICD-10-CM

## 2024-02-05 DIAGNOSIS — R7303 Prediabetes: Secondary | ICD-10-CM | POA: Insufficient documentation

## 2024-02-05 DIAGNOSIS — Z7982 Long term (current) use of aspirin: Secondary | ICD-10-CM | POA: Diagnosis not present

## 2024-02-05 DIAGNOSIS — G5603 Carpal tunnel syndrome, bilateral upper limbs: Secondary | ICD-10-CM

## 2024-02-05 DIAGNOSIS — Z79899 Other long term (current) drug therapy: Secondary | ICD-10-CM | POA: Insufficient documentation

## 2024-02-05 DIAGNOSIS — Z6841 Body Mass Index (BMI) 40.0 and over, adult: Secondary | ICD-10-CM | POA: Insufficient documentation

## 2024-02-05 HISTORY — DX: Other ventricular tachycardia: I47.29

## 2024-02-05 HISTORY — DX: Cardiac murmur, unspecified: R01.1

## 2024-02-05 HISTORY — DX: Carpal tunnel syndrome, unspecified upper limb: G56.00

## 2024-02-05 HISTORY — DX: Dilated cardiomyopathy: I42.0

## 2024-02-05 HISTORY — DX: Motion sickness, initial encounter: T75.3XXA

## 2024-02-05 HISTORY — DX: Insomnia, unspecified: G47.00

## 2024-02-05 HISTORY — DX: Obstructive sleep apnea (adult) (pediatric): G47.33

## 2024-02-05 HISTORY — DX: Benign prostatic hyperplasia without lower urinary tract symptoms: N40.0

## 2024-02-05 HISTORY — PX: CARPAL TUNNEL RELEASE: SHX101

## 2024-02-05 HISTORY — DX: Other ill-defined heart diseases: I51.89

## 2024-02-05 HISTORY — DX: Long term (current) use of aspirin: Z79.82

## 2024-02-05 HISTORY — DX: Other forms of dyspnea: R06.09

## 2024-02-05 SURGERY — CARPAL TUNNEL RELEASE
Anesthesia: General | Site: Wrist | Laterality: Left

## 2024-02-05 MED ORDER — FENTANYL CITRATE (PF) 100 MCG/2ML IJ SOLN
INTRAMUSCULAR | Status: AC
  Filled 2024-02-05: qty 2

## 2024-02-05 MED ORDER — 0.9 % SODIUM CHLORIDE (POUR BTL) OPTIME
TOPICAL | Status: DC | PRN
Start: 2024-02-05 — End: 2024-02-05
  Administered 2024-02-05: 500 mL

## 2024-02-05 MED ORDER — PROPOFOL 500 MG/50ML IV EMUL
INTRAVENOUS | Status: DC | PRN
  Administered 2024-02-05: 100 ug/kg/min via INTRAVENOUS

## 2024-02-05 MED ORDER — DEXAMETHASONE SODIUM PHOSPHATE 10 MG/ML IJ SOLN
INTRAMUSCULAR | Status: DC | PRN
Start: 2024-02-05 — End: 2024-02-05
  Administered 2024-02-05: 10 mg via INTRAVENOUS

## 2024-02-05 MED ORDER — CHLORHEXIDINE GLUCONATE 0.12 % MT SOLN
OROMUCOSAL | Status: AC
  Filled 2024-02-05: qty 15

## 2024-02-05 MED ORDER — ORAL CARE MOUTH RINSE: 15.0000 mL | Freq: Once | OROMUCOSAL | Status: AC

## 2024-02-05 MED ORDER — LIDOCAINE 2% (20 MG/ML) 5 ML SYRINGE
INTRAMUSCULAR | Status: DC | PRN
Start: 2024-02-05 — End: 2024-02-05
  Administered 2024-02-05: 60 mg via INTRAVENOUS

## 2024-02-05 MED ORDER — LIDOCAINE HCL 1 % IJ SOLN
INTRAMUSCULAR | Status: DC | PRN
Start: 2024-02-05 — End: 2024-02-05
  Administered 2024-02-05: 4 mL

## 2024-02-05 MED ORDER — LACTATED RINGERS IV SOLN: INTRAVENOUS | Status: DC

## 2024-02-05 MED ORDER — CEFAZOLIN SODIUM-DEXTROSE 2-4 GM/100ML-% IV SOLN: 3.0000 g | Freq: Once | INTRAVENOUS | Status: DC

## 2024-02-05 MED ORDER — FENTANYL CITRATE (PF) 100 MCG/2ML IJ SOLN
INTRAMUSCULAR | Status: DC | PRN
  Administered 2024-02-05 (×2): 25 ug via INTRAVENOUS

## 2024-02-05 MED ORDER — FENTANYL CITRATE (PF) 100 MCG/2ML IJ SOLN: 25.0000 ug | INTRAMUSCULAR | Status: DC | PRN

## 2024-02-05 MED ORDER — PROPOFOL 10 MG/ML IV BOLUS
INTRAVENOUS | Status: DC | PRN
  Administered 2024-02-05 (×2): 20 mg via INTRAVENOUS

## 2024-02-05 MED ORDER — GLYCOPYRROLATE 0.2 MG/ML IJ SOLN
INTRAMUSCULAR | Status: AC
  Filled 2024-02-05: qty 1

## 2024-02-05 MED ORDER — DEXMEDETOMIDINE HCL IN NACL 80 MCG/20ML IV SOLN
INTRAVENOUS | Status: DC | PRN
  Administered 2024-02-05: 8 ug via INTRAVENOUS
  Administered 2024-02-05: 12 ug via INTRAVENOUS

## 2024-02-05 MED ORDER — ONDANSETRON HCL 4 MG/2ML IJ SOLN
INTRAMUSCULAR | Status: DC | PRN
  Administered 2024-02-05: 4 mg via INTRAVENOUS

## 2024-02-05 MED ORDER — CEFAZOLIN SODIUM-DEXTROSE 3-4 GM/150ML-% IV SOLN
3.0000 g | INTRAVENOUS | Status: AC
  Administered 2024-02-05: 3 g via INTRAVENOUS
  Filled 2024-02-05: qty 150

## 2024-02-05 MED ORDER — GLYCOPYRROLATE 0.2 MG/ML IJ SOLN
INTRAMUSCULAR | Status: DC | PRN
  Administered 2024-02-05 (×2): .1 mg via INTRAVENOUS

## 2024-02-05 MED ORDER — CHLORHEXIDINE GLUCONATE 0.12 % MT SOLN
15.0000 mL | Freq: Once | OROMUCOSAL | Status: AC
  Administered 2024-02-05: 15 mL via OROMUCOSAL

## 2024-02-05 SURGICAL SUPPLY — 20 items
BNDG ADH 1X3 SHEER STRL LF (GAUZE/BANDAGES/DRESSINGS) ×1 IMPLANT
BNDG ADH 2 X3.75 FABRIC TAN LF (GAUZE/BANDAGES/DRESSINGS) IMPLANT
BRUSH SCRUB EZ 4% CHG (MISCELLANEOUS) ×1 IMPLANT
CHLORAPREP W/TINT 26 (MISCELLANEOUS) ×1 IMPLANT
COVER PROBE FLX POLY STRL (MISCELLANEOUS) ×1 IMPLANT
DERMABOND ADVANCED .7 DNX12 (GAUZE/BANDAGES/DRESSINGS) ×1 IMPLANT
DRAPE EXTREMITY 106X87X128.5 (DRAPES) ×1 IMPLANT
DRAPE IMP U-DRAPE 54X76 (DRAPES) ×1 IMPLANT
DRAPE SHEET LG 3/4 BI-LAMINATE (DRAPES) ×1 IMPLANT
GLOVE BIOGEL PI IND STRL 8 (GLOVE) ×1 IMPLANT
GLOVE SRG 8 PF TXTR STRL LF DI (GLOVE) ×1 IMPLANT
GLOVE SURG SYN 7.5 PF PI (GLOVE) ×1 IMPLANT
GOWN SRG XL LVL 3 NONREINFORCE (GOWNS) ×1 IMPLANT
KIT TURNOVER KIT A (KITS) ×1 IMPLANT
NDL HYPO 25X1 1.5 SAFETY (NEEDLE) ×1 IMPLANT
NEEDLE HYPO 25X1 1.5 SAFETY (NEEDLE) ×1 IMPLANT
NS IRRIG 500ML POUR BTL (IV SOLUTION) ×1 IMPLANT
PACK BASIN MINOR ARMC (MISCELLANEOUS) ×1 IMPLANT
STOCKINETTE IMPERVIOUS 9X36 MD (GAUZE/BANDAGES/DRESSINGS) ×1 IMPLANT
ULTRAGLIDE CTR (BLADE) ×1 IMPLANT

## 2024-02-05 NOTE — Transfer of Care (Signed)
 Immediate Anesthesia Transfer of Care Note  Patient: Bryan Boyle  Procedure(s) Performed: CARPAL TUNNEL RELEASE (Left: Wrist)  Patient Location: PACU  Anesthesia Type:MAC  Level of Consciousness: awake, oriented, and patient cooperative  Airway & Oxygen Therapy: Patient Spontanous Breathing and Patient connected to face mask oxygen  Post-op Assessment: Report given to RN and Post -op Vital signs reviewed and stable  Post vital signs: Reviewed and stable  Last Vitals:  Vitals Value Taken Time  BP 120/67 02/05/24 08:30  Temp 36.2 C 02/05/24 08:29  Pulse 67 02/05/24 08:31  Resp 23 02/05/24 08:31  SpO2 95 % 02/05/24 08:31  Vitals shown include unfiled device data.  Last Pain:  Vitals:   02/05/24 0829  TempSrc: Oral  PainSc:          Complications: No notable events documented.

## 2024-02-05 NOTE — Op Note (Signed)
 Indications: Patient with a history of median neuropathy at the wrist with hand weakness refractory to conservative management.  Findings: Severe compression of the median nerve at the transverse carpal ligament  Preoperative Diagnosis:G56.02 Left carpal tunnel syndrome  Postoperative Diagnosis: G56.02 Left carpal tunnel syndrome   Postoperative Diagnosis: same   EBL: Minimal IVF: See anesthesia report Drains: none Disposition:Stable to PACU Complications: none  No foley catheter was placed.   Preoperative Note: patient with a history of progressive left median neuropathy with hand weakness refractory to conservative management.  They had tried rest, padding, and watchful waiting but had continued progressive symptoms.  Given the progression of her median neuropathy plan was made for median nerve decompression  Risk of surgery is discussed and include: Infection, bleeding, wound healing issues, pillar pain nerve injury, pain, failure to relieve the symptoms, need for further surgery.  Procedure:  1) left sided carpal tunnel decompression with ultrasound guidance   Procedure: After obtaining informed consent, the patient taken to the operating room, placed in supine position, monitored anesthesia care was induced.  They were given preoperative antibiotics.  Prepped and draped in the usual fashion.  Comprehensive timeout was performed verifying the patient's name, MRN, planned procedure.  An ultrasound was used with a sterile probe.  We used this to mark out our safety points including the interval between the ulnar artery and median nerve.  We identified the motor branch as well as the first sensory branch which were both in safe position.  We also identified the vascular arcade which was in safe positioning as well.  At this point we placed a block with Marcaine without epinephrine.  We blocked the skin where the incision would be, the superficial sensory median nerve, as well as the  transverse carpal ligament.  Under ultrasound guidance we utilized the local anesthetic to perform a hydrodissection of the nerve from the transverse carpal ligament.  We then prepped the sonexs ultra CTR knife on the back table while the anesthetic set in.  We performed a small linear incision approximately 2 to 3 mm.  We then utilized a Statistician under ultrasound guidance to identify the underside of the transverse carpal ligament.  The fat and connective tissue was dissected off the underside.  We could feel that this was quite thickened and calcified.  Causing severe compression.  Once we had a clear tract we then placed the ultrasound-guided knife into the incision and advanced it through the carpal tunnel.  We verified the safety zones including the median nerve which did not significantly cross over the knife.  We are able to see the first sensory branch which was not crossing the knife.  The artery was also in a safe place.  At this point we divided the transverse carpal ligament under ultrasound guidance, to get a full release it took 2 passes.  The nerve relaxed laterally and was well decompressed.  After the 2 passes we placed a [place] 4 into the wound and were able to feel a complete dissection of the transverse carpal ligament.  We then irrigated, we got meticulous hemostasis.  Skin glue was placed on the incision and a Band-Aid was placed on top once this was dried.  No immediate complications.  Sponge and pattie counts were correct at the end of the procedure.   I performed the procedure without an assistant surgeon  Lovenia Kim, MD/MSCR

## 2024-02-05 NOTE — Anesthesia Preprocedure Evaluation (Signed)
 Anesthesia Evaluation  Patient identified by MRN, date of birth, ID band Patient awake    Reviewed: Allergy & Precautions, H&P , NPO status , Patient's Chart, lab work & pertinent test results, reviewed documented beta blocker date and time   History of Anesthesia Complications Negative for: history of anesthetic complications  Airway Mallampati: III  TM Distance: >3 FB Neck ROM: full    Dental  (+) Implants, Caps, Missing, Teeth Intact   Pulmonary neg shortness of breath, sleep apnea and Continuous Positive Airway Pressure Ventilation , neg COPD, Recent URI  (s/p Zpac last week), former smoker   Pulmonary exam normal breath sounds clear to auscultation       Cardiovascular Exercise Tolerance: Good hypertension, (-) angina (-) CAD, (-) Past MI and (-) Cardiac Stents Normal cardiovascular exam+ dysrhythmias (s/p ablation) + Valvular Problems/Murmurs  Rhythm:regular Rate:Normal     Neuro/Psych negative neurological ROS  negative psych ROS   GI/Hepatic negative GI ROS, Neg liver ROS,,,  Endo/Other  neg diabetes  Class 3 obesity  Renal/GU negative Renal ROS  negative genitourinary   Musculoskeletal   Abdominal   Peds  Hematology negative hematology ROS (+)   Anesthesia Other Findings Past Medical History: 01/09/2024: Bilateral carpal tunnel syndrome No date: BPH (benign prostatic hyperplasia) No date: Cardiac murmur No date: Cardiomyopathy, dilated, nonischemic (HCC) No date: Carpal tunnel syndrome 05/25/2023: Central scotoma, bilateral No date: Cervical spinal stenosis 11/15/2020: Choroidal nevus of left eye 01/19/2023: Chronic pain syndrome 05/19/2013: Cobalamin deficiency No date: Diastolic dysfunction No date: DOE (dyspnea on exertion) 06/07/2012: Erectile dysfunction 12/17/2023: Hypercholesterolemia No date: Hypertension No date: Insomnia     Comment:  a.) uses melatonin PRN No date: Long-term use of  aspirin therapy No date: Lumbar stenosis No date: Macular degeneration 06/07/2012: Melanoma in situ (HCC) No date: Motion sickness No date: NSVT (nonsustained ventricular tachycardia) (HCC)     Comment:  a.) s/p RFA ablation 01/26/2020 No date: OSA on CPAP 11/01/2012: Osteoarthritis of both knees No date: Pre-diabetes No date: PVC's (premature ventricular contractions) 06/07/2012: Seasonal allergic rhinitis No date: Vitamin D deficiency   Reproductive/Obstetrics negative OB ROS                              Anesthesia Physical Anesthesia Plan  ASA: 3  Anesthesia Plan: General   Post-op Pain Management:    Induction: Intravenous  PONV Risk Score and Plan: 2 and Propofol  infusion and TIVA  Airway Management Planned: Natural Airway and Simple Face Mask  Additional Equipment:   Intra-op Plan:   Post-operative Plan:   Informed Consent: I have reviewed the patients History and Physical, chart, labs and discussed the procedure including the risks, benefits and alternatives for the proposed anesthesia with the patient or authorized representative who has indicated his/her understanding and acceptance.     Dental Advisory Given  Plan Discussed with: Anesthesiologist, CRNA and Surgeon  Anesthesia Plan Comments:         Anesthesia Quick Evaluation

## 2024-02-05 NOTE — Discharge Instructions (Signed)

## 2024-02-05 NOTE — Interval H&P Note (Signed)
 History and Physical Interval Note:  02/05/2024 7:46 AM  Bryan Boyle  has presented today for surgery, with the diagnosis of G56.02 Left carpal tunnel syndrome.  The various methods of treatment have been discussed with the patient and family. After consideration of risks, benefits and other options for treatment, the patient has consented to  Procedure(s) with comments: CARPAL TUNNEL RELEASE (Left) - LEFT CARPAL TUNNEL RELEASE WITH ULTRASOUND GUIDANCE as a surgical intervention.  The patient's history has been reviewed, patient examined, no change in status, stable for surgery.  I have reviewed the patient's chart and labs.  Questions were answered to the patient's satisfaction.    Heart and lungs clear   Bryan Boyle

## 2024-02-06 ENCOUNTER — Encounter: Payer: Self-pay | Admitting: Neurosurgery

## 2024-02-06 NOTE — Anesthesia Postprocedure Evaluation (Signed)
 Anesthesia Post Note  Patient: Bryan Boyle  Procedure(s) Performed: CARPAL TUNNEL RELEASE (Left: Wrist)  Patient location during evaluation: PACU Anesthesia Type: General Level of consciousness: awake and alert Pain management: pain level controlled Vital Signs Assessment: post-procedure vital signs reviewed and stable Respiratory status: spontaneous breathing, nonlabored ventilation, respiratory function stable and patient connected to nasal cannula oxygen Cardiovascular status: blood pressure returned to baseline and stable Postop Assessment: no apparent nausea or vomiting Anesthetic complications: no   No notable events documented.   Last Vitals:  Vitals:   02/05/24 0901 02/05/24 0913  BP: (!) 132/59 (!) 161/72  Pulse: 66 69  Resp: 11 16  Temp: (!) 36.4 C (!) 36.1 C  SpO2: 97% 100%    Last Pain:  Vitals:   02/05/24 0913  TempSrc: Temporal  PainSc: 0-No pain                 Prentice Murphy

## 2024-02-20 ENCOUNTER — Encounter: Payer: Self-pay | Admitting: Physician Assistant

## 2024-02-20 ENCOUNTER — Ambulatory Visit (INDEPENDENT_AMBULATORY_CARE_PROVIDER_SITE_OTHER): Admitting: Physician Assistant

## 2024-02-20 VITALS — BP 122/74 | Temp 97.9°F | Ht 70.0 in | Wt 310.0 lb

## 2024-02-20 DIAGNOSIS — G5602 Carpal tunnel syndrome, left upper limb: Secondary | ICD-10-CM

## 2024-02-20 DIAGNOSIS — Z09 Encounter for follow-up examination after completed treatment for conditions other than malignant neoplasm: Secondary | ICD-10-CM

## 2024-02-20 DIAGNOSIS — G5603 Carpal tunnel syndrome, bilateral upper limbs: Secondary | ICD-10-CM

## 2024-02-20 NOTE — Progress Notes (Signed)
   REFERRING PHYSICIAN:  Cleotilde Bernett BIRCH, Md No address on file  DOS: 02/05/24, L CTR   HISTORY OF PRESENT ILLNESS: Bryan Boyle is 2 weeks status post left carpal tunnel release. Overall, he is doing well.  He has noticed some improvement, but he notices at night or when he presses on his palm he will have some increased pain.       PHYSICAL EXAMINATION:  NEUROLOGICAL:  General: In no acute distress.   Awake, alert, oriented to person, place, and time.  Pupils equal round and reactive to light.  Facial tone is symmetric.    Strength: Overall to baseline.  Some mild intrinsic weakness. Incision c/d/I  Imaging:  No new imaging prior to this appointment  Assessment / Plan: Bryan Boyle is doing well after left carpal tunnel release. We discussed activity escalation and I have advised the patient to lift up to 10 pounds until 6 weeks after surgery, then increase up to 25 pounds until 12 weeks after surgery.  After 12 weeks post-op, the patient advised to increase activity as tolerated. he will return to clinic in approximately 1 month for 6-week postop visit.  He is also eager to potentially have his contralateral side done.  I stated that we would talk about this further at his next visit.  Patient is family were counseled.  Advised to reach out to me if they have any questions or concerns in the future.    Advised to contact the office if any questions or concerns arise.   Lyle Decamp PA-C Dept of Neurosurgery

## 2024-03-19 ENCOUNTER — Encounter: Payer: Self-pay | Admitting: Neurosurgery

## 2024-03-19 ENCOUNTER — Ambulatory Visit (INDEPENDENT_AMBULATORY_CARE_PROVIDER_SITE_OTHER): Admitting: Physician Assistant

## 2024-03-19 VITALS — BP 122/68 | Ht 70.0 in | Wt 310.0 lb

## 2024-03-19 DIAGNOSIS — G5603 Carpal tunnel syndrome, bilateral upper limbs: Secondary | ICD-10-CM

## 2024-03-19 DIAGNOSIS — G5602 Carpal tunnel syndrome, left upper limb: Secondary | ICD-10-CM

## 2024-03-19 DIAGNOSIS — Z09 Encounter for follow-up examination after completed treatment for conditions other than malignant neoplasm: Secondary | ICD-10-CM

## 2024-03-19 NOTE — Progress Notes (Signed)
   REFERRING PHYSICIAN:  Cleotilde Bernett BIRCH, Md No address on file  DOS: 02/05/24, L CTR   HISTORY OF PRESENT ILLNESS: Bryan Boyle is 6 weeks status post left carpal tunnel release. Overall, he is doing well.  He has noticed some improvement in decrease in his pain.  However, he states that when he presses on his fingers of his left hand he will have some shooting pain or numbness that occurs.  He is no longer wearing a brace.  His pain is under control.    PHYSICAL EXAMINATION:  NEUROLOGICAL:  General: In no acute distress.   Awake, alert, oriented to person, place, and time.  Pupils equal round and reactive to light.  Facial tone is symmetric.    Strength: Overall to baseline.  Some intrinsic weakness noted. Incision c/d/I  Imaging:  No new imaging prior to this appointment  Assessment / Plan: Bryan Boyle is doing well after left carpal tunnel release. We discussed activity escalation and I have advised the patient he may now increase lifting up to 25 pounds until 12 weeks after surgery.  After 12 weeks post-op, the patient advised to increase activity as tolerated. he will return to clinic in approximately 6 weeks for his 49-month postop visit.  He is also eager to potentially have his contralateral side done.  I stated that we would talk about this further at his next visit.  Patient and his family were counseled.  Advised to reach out to me if they have any questions or concerns in the future.   Advised to contact the office if any questions or concerns arise.   Lyle Decamp PA-C Dept of Neurosurgery

## 2024-06-23 ENCOUNTER — Encounter: Payer: Self-pay | Admitting: Neurosurgery

## 2024-06-23 ENCOUNTER — Ambulatory Visit: Admitting: Neurosurgery

## 2024-06-23 VITALS — BP 126/62 | Ht 71.0 in | Wt 330.0 lb

## 2024-06-23 DIAGNOSIS — G5602 Carpal tunnel syndrome, left upper limb: Secondary | ICD-10-CM
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
## 2024-09-22 ENCOUNTER — Ambulatory Visit
